# Patient Record
Sex: Male | Born: 1935 | Race: White | Hispanic: No | Marital: Married | Smoking: Former smoker
Health system: Southern US, Community
[De-identification: ages and names within clinical notes are randomized; demographics above are authoritative.]

## PROBLEM LIST (undated history)

## (undated) DIAGNOSIS — Z86718 Personal history of other venous thrombosis and embolism: Secondary | ICD-10-CM

## (undated) DIAGNOSIS — M1990 Unspecified osteoarthritis, unspecified site: Secondary | ICD-10-CM

## (undated) DIAGNOSIS — N429 Disorder of prostate, unspecified: Secondary | ICD-10-CM

## (undated) HISTORY — DX: Unspecified osteoarthritis, unspecified site: M19.90

## (undated) HISTORY — PX: PROSTATE SURGERY: SHX751

## (undated) HISTORY — DX: Personal history of other venous thrombosis and embolism: Z86.718

---

## 2005-02-24 ENCOUNTER — Ambulatory Visit: Payer: Self-pay | Admitting: Internal Medicine

## 2005-11-15 ENCOUNTER — Ambulatory Visit: Payer: Self-pay | Admitting: Anesthesiology

## 2005-11-15 ENCOUNTER — Other Ambulatory Visit: Payer: Self-pay

## 2005-11-23 ENCOUNTER — Ambulatory Visit: Payer: Self-pay | Admitting: Urology

## 2008-04-10 ENCOUNTER — Ambulatory Visit: Payer: Self-pay | Admitting: Internal Medicine

## 2012-02-27 DIAGNOSIS — E039 Hypothyroidism, unspecified: Secondary | ICD-10-CM | POA: Diagnosis not present

## 2012-02-27 DIAGNOSIS — Z125 Encounter for screening for malignant neoplasm of prostate: Secondary | ICD-10-CM | POA: Diagnosis not present

## 2012-02-27 DIAGNOSIS — E7800 Pure hypercholesterolemia, unspecified: Secondary | ICD-10-CM | POA: Diagnosis not present

## 2012-02-27 DIAGNOSIS — E785 Hyperlipidemia, unspecified: Secondary | ICD-10-CM | POA: Diagnosis not present

## 2012-02-27 DIAGNOSIS — D649 Anemia, unspecified: Secondary | ICD-10-CM | POA: Diagnosis not present

## 2012-02-27 DIAGNOSIS — N390 Urinary tract infection, site not specified: Secondary | ICD-10-CM | POA: Diagnosis not present

## 2012-03-29 DIAGNOSIS — R972 Elevated prostate specific antigen [PSA]: Secondary | ICD-10-CM | POA: Diagnosis not present

## 2012-05-24 DIAGNOSIS — E8881 Metabolic syndrome: Secondary | ICD-10-CM | POA: Diagnosis not present

## 2012-05-24 DIAGNOSIS — I10 Essential (primary) hypertension: Secondary | ICD-10-CM | POA: Diagnosis not present

## 2012-05-24 DIAGNOSIS — D291 Benign neoplasm of prostate: Secondary | ICD-10-CM | POA: Diagnosis not present

## 2012-05-24 DIAGNOSIS — M519 Unspecified thoracic, thoracolumbar and lumbosacral intervertebral disc disorder: Secondary | ICD-10-CM | POA: Diagnosis not present

## 2012-08-06 DIAGNOSIS — R972 Elevated prostate specific antigen [PSA]: Secondary | ICD-10-CM | POA: Insufficient documentation

## 2012-08-06 DIAGNOSIS — N401 Enlarged prostate with lower urinary tract symptoms: Secondary | ICD-10-CM | POA: Diagnosis not present

## 2012-08-21 DIAGNOSIS — E785 Hyperlipidemia, unspecified: Secondary | ICD-10-CM | POA: Diagnosis not present

## 2012-08-21 DIAGNOSIS — M519 Unspecified thoracic, thoracolumbar and lumbosacral intervertebral disc disorder: Secondary | ICD-10-CM | POA: Diagnosis not present

## 2012-08-21 DIAGNOSIS — D291 Benign neoplasm of prostate: Secondary | ICD-10-CM | POA: Diagnosis not present

## 2012-10-11 DIAGNOSIS — Z23 Encounter for immunization: Secondary | ICD-10-CM | POA: Diagnosis not present

## 2012-11-13 DIAGNOSIS — E8881 Metabolic syndrome: Secondary | ICD-10-CM | POA: Diagnosis not present

## 2012-11-13 DIAGNOSIS — I10 Essential (primary) hypertension: Secondary | ICD-10-CM | POA: Diagnosis not present

## 2012-11-20 DIAGNOSIS — R972 Elevated prostate specific antigen [PSA]: Secondary | ICD-10-CM | POA: Diagnosis not present

## 2013-02-28 DIAGNOSIS — Z125 Encounter for screening for malignant neoplasm of prostate: Secondary | ICD-10-CM | POA: Diagnosis not present

## 2013-02-28 DIAGNOSIS — D649 Anemia, unspecified: Secondary | ICD-10-CM | POA: Diagnosis not present

## 2013-02-28 DIAGNOSIS — E8881 Metabolic syndrome: Secondary | ICD-10-CM | POA: Diagnosis not present

## 2013-02-28 DIAGNOSIS — I10 Essential (primary) hypertension: Secondary | ICD-10-CM | POA: Diagnosis not present

## 2013-02-28 DIAGNOSIS — T887XXA Unspecified adverse effect of drug or medicament, initial encounter: Secondary | ICD-10-CM | POA: Diagnosis not present

## 2013-03-12 DIAGNOSIS — E8881 Metabolic syndrome: Secondary | ICD-10-CM | POA: Diagnosis not present

## 2013-03-12 DIAGNOSIS — M519 Unspecified thoracic, thoracolumbar and lumbosacral intervertebral disc disorder: Secondary | ICD-10-CM | POA: Diagnosis not present

## 2013-03-12 DIAGNOSIS — K649 Unspecified hemorrhoids: Secondary | ICD-10-CM | POA: Diagnosis not present

## 2013-03-12 DIAGNOSIS — I10 Essential (primary) hypertension: Secondary | ICD-10-CM | POA: Diagnosis not present

## 2013-06-17 DIAGNOSIS — E8881 Metabolic syndrome: Secondary | ICD-10-CM | POA: Diagnosis not present

## 2013-06-17 DIAGNOSIS — K649 Unspecified hemorrhoids: Secondary | ICD-10-CM | POA: Diagnosis not present

## 2013-06-17 DIAGNOSIS — E785 Hyperlipidemia, unspecified: Secondary | ICD-10-CM | POA: Diagnosis not present

## 2013-06-17 DIAGNOSIS — I10 Essential (primary) hypertension: Secondary | ICD-10-CM | POA: Diagnosis not present

## 2013-08-08 DIAGNOSIS — R972 Elevated prostate specific antigen [PSA]: Secondary | ICD-10-CM | POA: Diagnosis not present

## 2013-08-08 DIAGNOSIS — N401 Enlarged prostate with lower urinary tract symptoms: Secondary | ICD-10-CM | POA: Diagnosis not present

## 2013-10-21 DIAGNOSIS — M519 Unspecified thoracic, thoracolumbar and lumbosacral intervertebral disc disorder: Secondary | ICD-10-CM | POA: Diagnosis not present

## 2013-10-21 DIAGNOSIS — R972 Elevated prostate specific antigen [PSA]: Secondary | ICD-10-CM | POA: Diagnosis not present

## 2013-10-21 DIAGNOSIS — E785 Hyperlipidemia, unspecified: Secondary | ICD-10-CM | POA: Diagnosis not present

## 2013-11-18 DIAGNOSIS — Z23 Encounter for immunization: Secondary | ICD-10-CM | POA: Diagnosis not present

## 2014-01-27 DIAGNOSIS — I10 Essential (primary) hypertension: Secondary | ICD-10-CM | POA: Diagnosis not present

## 2014-01-27 DIAGNOSIS — M545 Low back pain, unspecified: Secondary | ICD-10-CM | POA: Diagnosis not present

## 2014-01-27 DIAGNOSIS — E785 Hyperlipidemia, unspecified: Secondary | ICD-10-CM | POA: Diagnosis not present

## 2014-02-11 DIAGNOSIS — R972 Elevated prostate specific antigen [PSA]: Secondary | ICD-10-CM | POA: Diagnosis not present

## 2014-04-29 DIAGNOSIS — I10 Essential (primary) hypertension: Secondary | ICD-10-CM | POA: Diagnosis not present

## 2014-04-29 DIAGNOSIS — H1030 Unspecified acute conjunctivitis, unspecified eye: Secondary | ICD-10-CM | POA: Diagnosis not present

## 2014-04-29 DIAGNOSIS — E8881 Metabolic syndrome: Secondary | ICD-10-CM | POA: Diagnosis not present

## 2014-08-01 DIAGNOSIS — M519 Unspecified thoracic, thoracolumbar and lumbosacral intervertebral disc disorder: Secondary | ICD-10-CM | POA: Diagnosis not present

## 2014-08-01 DIAGNOSIS — E8881 Metabolic syndrome: Secondary | ICD-10-CM | POA: Diagnosis not present

## 2014-08-01 DIAGNOSIS — D291 Benign neoplasm of prostate: Secondary | ICD-10-CM | POA: Diagnosis not present

## 2014-08-01 DIAGNOSIS — I10 Essential (primary) hypertension: Secondary | ICD-10-CM | POA: Diagnosis not present

## 2014-08-14 DIAGNOSIS — N401 Enlarged prostate with lower urinary tract symptoms: Secondary | ICD-10-CM | POA: Diagnosis not present

## 2014-08-14 DIAGNOSIS — R972 Elevated prostate specific antigen [PSA]: Secondary | ICD-10-CM | POA: Diagnosis not present

## 2014-08-14 DIAGNOSIS — Z720 Tobacco use: Secondary | ICD-10-CM | POA: Diagnosis not present

## 2014-10-03 DIAGNOSIS — Z23 Encounter for immunization: Secondary | ICD-10-CM | POA: Diagnosis not present

## 2014-11-21 DIAGNOSIS — I10 Essential (primary) hypertension: Secondary | ICD-10-CM | POA: Diagnosis not present

## 2014-11-21 DIAGNOSIS — E8881 Metabolic syndrome: Secondary | ICD-10-CM | POA: Diagnosis not present

## 2014-11-21 DIAGNOSIS — M5186 Other intervertebral disc disorders, lumbar region: Secondary | ICD-10-CM | POA: Diagnosis not present

## 2014-12-31 DIAGNOSIS — N401 Enlarged prostate with lower urinary tract symptoms: Secondary | ICD-10-CM | POA: Diagnosis not present

## 2014-12-31 DIAGNOSIS — R972 Elevated prostate specific antigen [PSA]: Secondary | ICD-10-CM | POA: Diagnosis not present

## 2015-01-07 DIAGNOSIS — H6123 Impacted cerumen, bilateral: Secondary | ICD-10-CM | POA: Diagnosis not present

## 2015-01-07 DIAGNOSIS — H903 Sensorineural hearing loss, bilateral: Secondary | ICD-10-CM | POA: Diagnosis not present

## 2015-03-27 DIAGNOSIS — M5186 Other intervertebral disc disorders, lumbar region: Secondary | ICD-10-CM | POA: Diagnosis not present

## 2015-03-27 DIAGNOSIS — E784 Other hyperlipidemia: Secondary | ICD-10-CM | POA: Diagnosis not present

## 2015-03-27 DIAGNOSIS — I10 Essential (primary) hypertension: Secondary | ICD-10-CM | POA: Diagnosis not present

## 2015-07-28 DIAGNOSIS — E784 Other hyperlipidemia: Secondary | ICD-10-CM | POA: Diagnosis not present

## 2015-10-12 DIAGNOSIS — R972 Elevated prostate specific antigen [PSA]: Secondary | ICD-10-CM | POA: Diagnosis not present

## 2015-10-12 DIAGNOSIS — N401 Enlarged prostate with lower urinary tract symptoms: Secondary | ICD-10-CM | POA: Diagnosis not present

## 2015-10-12 DIAGNOSIS — Z6828 Body mass index (BMI) 28.0-28.9, adult: Secondary | ICD-10-CM | POA: Diagnosis not present

## 2015-11-20 DIAGNOSIS — Z125 Encounter for screening for malignant neoplasm of prostate: Secondary | ICD-10-CM | POA: Diagnosis not present

## 2015-11-20 DIAGNOSIS — E784 Other hyperlipidemia: Secondary | ICD-10-CM | POA: Diagnosis not present

## 2015-11-20 DIAGNOSIS — R5381 Other malaise: Secondary | ICD-10-CM | POA: Diagnosis not present

## 2015-11-20 DIAGNOSIS — I10 Essential (primary) hypertension: Secondary | ICD-10-CM | POA: Diagnosis not present

## 2015-11-27 DIAGNOSIS — I10 Essential (primary) hypertension: Secondary | ICD-10-CM | POA: Diagnosis not present

## 2015-11-27 DIAGNOSIS — E784 Other hyperlipidemia: Secondary | ICD-10-CM | POA: Diagnosis not present

## 2015-11-27 DIAGNOSIS — D291 Benign neoplasm of prostate: Secondary | ICD-10-CM | POA: Diagnosis not present

## 2016-03-24 DIAGNOSIS — D291 Benign neoplasm of prostate: Secondary | ICD-10-CM | POA: Diagnosis not present

## 2016-03-24 DIAGNOSIS — I10 Essential (primary) hypertension: Secondary | ICD-10-CM | POA: Diagnosis not present

## 2016-03-24 DIAGNOSIS — E784 Other hyperlipidemia: Secondary | ICD-10-CM | POA: Diagnosis not present

## 2016-03-24 DIAGNOSIS — E8881 Metabolic syndrome: Secondary | ICD-10-CM | POA: Diagnosis not present

## 2016-07-29 DIAGNOSIS — I10 Essential (primary) hypertension: Secondary | ICD-10-CM | POA: Diagnosis not present

## 2016-07-29 DIAGNOSIS — Z23 Encounter for immunization: Secondary | ICD-10-CM | POA: Diagnosis not present

## 2016-07-29 DIAGNOSIS — E8881 Metabolic syndrome: Secondary | ICD-10-CM | POA: Diagnosis not present

## 2016-07-29 DIAGNOSIS — M5186 Other intervertebral disc disorders, lumbar region: Secondary | ICD-10-CM | POA: Diagnosis not present

## 2016-07-29 DIAGNOSIS — D291 Benign neoplasm of prostate: Secondary | ICD-10-CM | POA: Diagnosis not present

## 2016-11-25 DIAGNOSIS — K649 Unspecified hemorrhoids: Secondary | ICD-10-CM | POA: Diagnosis not present

## 2016-11-25 DIAGNOSIS — E784 Other hyperlipidemia: Secondary | ICD-10-CM | POA: Diagnosis not present

## 2016-11-25 DIAGNOSIS — M549 Dorsalgia, unspecified: Secondary | ICD-10-CM | POA: Diagnosis not present

## 2016-11-25 DIAGNOSIS — I10 Essential (primary) hypertension: Secondary | ICD-10-CM | POA: Diagnosis not present

## 2017-03-10 DIAGNOSIS — R5381 Other malaise: Secondary | ICD-10-CM | POA: Diagnosis not present

## 2017-03-10 DIAGNOSIS — Z125 Encounter for screening for malignant neoplasm of prostate: Secondary | ICD-10-CM | POA: Diagnosis not present

## 2017-03-10 DIAGNOSIS — E784 Other hyperlipidemia: Secondary | ICD-10-CM | POA: Diagnosis not present

## 2017-03-10 DIAGNOSIS — I10 Essential (primary) hypertension: Secondary | ICD-10-CM | POA: Diagnosis not present

## 2017-03-16 DIAGNOSIS — I509 Heart failure, unspecified: Secondary | ICD-10-CM | POA: Diagnosis not present

## 2017-03-16 DIAGNOSIS — M549 Dorsalgia, unspecified: Secondary | ICD-10-CM | POA: Diagnosis not present

## 2017-03-16 DIAGNOSIS — K649 Unspecified hemorrhoids: Secondary | ICD-10-CM | POA: Diagnosis not present

## 2017-03-16 DIAGNOSIS — I10 Essential (primary) hypertension: Secondary | ICD-10-CM | POA: Diagnosis not present

## 2017-08-03 DIAGNOSIS — D291 Benign neoplasm of prostate: Secondary | ICD-10-CM | POA: Diagnosis not present

## 2017-08-03 DIAGNOSIS — I509 Heart failure, unspecified: Secondary | ICD-10-CM | POA: Diagnosis not present

## 2017-08-03 DIAGNOSIS — Z23 Encounter for immunization: Secondary | ICD-10-CM | POA: Diagnosis not present

## 2017-08-03 DIAGNOSIS — E784 Other hyperlipidemia: Secondary | ICD-10-CM | POA: Diagnosis not present

## 2017-09-21 DIAGNOSIS — H6123 Impacted cerumen, bilateral: Secondary | ICD-10-CM | POA: Diagnosis not present

## 2017-09-21 DIAGNOSIS — H60339 Swimmer's ear, unspecified ear: Secondary | ICD-10-CM | POA: Diagnosis not present

## 2017-09-21 DIAGNOSIS — H903 Sensorineural hearing loss, bilateral: Secondary | ICD-10-CM | POA: Diagnosis not present

## 2018-04-12 DIAGNOSIS — I10 Essential (primary) hypertension: Secondary | ICD-10-CM | POA: Diagnosis not present

## 2018-04-12 DIAGNOSIS — E785 Hyperlipidemia, unspecified: Secondary | ICD-10-CM | POA: Diagnosis not present

## 2018-04-12 DIAGNOSIS — K649 Unspecified hemorrhoids: Secondary | ICD-10-CM | POA: Diagnosis not present

## 2018-04-12 DIAGNOSIS — I509 Heart failure, unspecified: Secondary | ICD-10-CM | POA: Diagnosis not present

## 2018-05-11 ENCOUNTER — Emergency Department: Payer: Medicare Other

## 2018-05-11 ENCOUNTER — Inpatient Hospital Stay
Admission: EM | Admit: 2018-05-11 | Discharge: 2018-05-15 | DRG: 566 | Disposition: A | Discharge status: Home Health | Payer: Medicare Other | Attending: Internal Medicine | Admitting: Internal Medicine

## 2018-05-11 DIAGNOSIS — M25511 Pain in right shoulder: Secondary | ICD-10-CM | POA: Diagnosis not present

## 2018-05-11 DIAGNOSIS — E876 Hypokalemia: Secondary | ICD-10-CM | POA: Diagnosis present

## 2018-05-11 DIAGNOSIS — W19XXXA Unspecified fall, initial encounter: Secondary | ICD-10-CM

## 2018-05-11 DIAGNOSIS — G8929 Other chronic pain: Secondary | ICD-10-CM | POA: Diagnosis not present

## 2018-05-11 DIAGNOSIS — Y92015 Private garage of single-family (private) house as the place of occurrence of the external cause: Secondary | ICD-10-CM

## 2018-05-11 DIAGNOSIS — R350 Frequency of micturition: Secondary | ICD-10-CM | POA: Diagnosis present

## 2018-05-11 DIAGNOSIS — T796XXA Traumatic ischemia of muscle, initial encounter: Principal | ICD-10-CM | POA: Diagnosis present

## 2018-05-11 DIAGNOSIS — Z791 Long term (current) use of non-steroidal anti-inflammatories (NSAID): Secondary | ICD-10-CM

## 2018-05-11 DIAGNOSIS — I2510 Atherosclerotic heart disease of native coronary artery without angina pectoris: Secondary | ICD-10-CM | POA: Diagnosis present

## 2018-05-11 DIAGNOSIS — I214 Non-ST elevation (NSTEMI) myocardial infarction: Secondary | ICD-10-CM | POA: Diagnosis present

## 2018-05-11 DIAGNOSIS — S4991XA Unspecified injury of right shoulder and upper arm, initial encounter: Secondary | ICD-10-CM | POA: Diagnosis not present

## 2018-05-11 DIAGNOSIS — Z87891 Personal history of nicotine dependence: Secondary | ICD-10-CM

## 2018-05-11 DIAGNOSIS — M545 Low back pain: Secondary | ICD-10-CM | POA: Diagnosis not present

## 2018-05-11 DIAGNOSIS — N429 Disorder of prostate, unspecified: Secondary | ICD-10-CM | POA: Diagnosis present

## 2018-05-11 DIAGNOSIS — Z66 Do not resuscitate: Secondary | ICD-10-CM | POA: Diagnosis not present

## 2018-05-11 DIAGNOSIS — R748 Abnormal levels of other serum enzymes: Secondary | ICD-10-CM | POA: Diagnosis not present

## 2018-05-11 DIAGNOSIS — W1830XA Fall on same level, unspecified, initial encounter: Secondary | ICD-10-CM | POA: Diagnosis present

## 2018-05-11 DIAGNOSIS — R7989 Other specified abnormal findings of blood chemistry: Secondary | ICD-10-CM | POA: Diagnosis not present

## 2018-05-11 DIAGNOSIS — R778 Other specified abnormalities of plasma proteins: Secondary | ICD-10-CM

## 2018-05-11 DIAGNOSIS — S42141S Displaced fracture of glenoid cavity of scapula, right shoulder, sequela: Secondary | ICD-10-CM

## 2018-05-11 DIAGNOSIS — Z7982 Long term (current) use of aspirin: Secondary | ICD-10-CM | POA: Diagnosis not present

## 2018-05-11 DIAGNOSIS — S0990XA Unspecified injury of head, initial encounter: Secondary | ICD-10-CM | POA: Diagnosis not present

## 2018-05-11 DIAGNOSIS — N401 Enlarged prostate with lower urinary tract symptoms: Secondary | ICD-10-CM | POA: Diagnosis present

## 2018-05-11 DIAGNOSIS — I441 Atrioventricular block, second degree: Secondary | ICD-10-CM | POA: Diagnosis present

## 2018-05-11 DIAGNOSIS — S46011A Strain of muscle(s) and tendon(s) of the rotator cuff of right shoulder, initial encounter: Secondary | ICD-10-CM | POA: Diagnosis present

## 2018-05-11 HISTORY — DX: Disorder of prostate, unspecified: N42.9

## 2018-05-11 LAB — COMPREHENSIVE METABOLIC PANEL
ALK PHOS: 30 U/L — AB (ref 38–126)
ALT: 17 U/L (ref 0–44)
AST: 34 U/L (ref 15–41)
Albumin: 3.3 g/dL — ABNORMAL LOW (ref 3.5–5.0)
Anion gap: 8 (ref 5–15)
BUN: 17 mg/dL (ref 8–23)
CALCIUM: 8.5 mg/dL — AB (ref 8.9–10.3)
CO2: 25 mmol/L (ref 22–32)
CREATININE: 0.95 mg/dL (ref 0.61–1.24)
Chloride: 100 mmol/L (ref 98–111)
Glucose, Bld: 155 mg/dL — ABNORMAL HIGH (ref 70–99)
Potassium: 3.4 mmol/L — ABNORMAL LOW (ref 3.5–5.1)
Sodium: 133 mmol/L — ABNORMAL LOW (ref 135–145)
Total Bilirubin: 1.2 mg/dL (ref 0.3–1.2)
Total Protein: 5.7 g/dL — ABNORMAL LOW (ref 6.5–8.1)

## 2018-05-11 LAB — CBC WITH DIFFERENTIAL/PLATELET
Basophils Absolute: 0 10*3/uL (ref 0–0.1)
Basophils Relative: 0 %
Eosinophils Absolute: 0 10*3/uL (ref 0–0.7)
Eosinophils Relative: 0 %
HCT: 39.7 % — ABNORMAL LOW (ref 40.0–52.0)
HEMOGLOBIN: 13.7 g/dL (ref 13.0–18.0)
LYMPHS ABS: 0.5 10*3/uL — AB (ref 1.0–3.6)
Lymphocytes Relative: 5 %
MCH: 31 pg (ref 26.0–34.0)
MCHC: 34.5 g/dL (ref 32.0–36.0)
MCV: 89.9 fL (ref 80.0–100.0)
Monocytes Absolute: 0.8 10*3/uL (ref 0.2–1.0)
Monocytes Relative: 7 %
NEUTROS ABS: 10.5 10*3/uL — AB (ref 1.4–6.5)
NEUTROS PCT: 88 %
Platelets: 171 10*3/uL (ref 150–440)
RBC: 4.41 MIL/uL (ref 4.40–5.90)
RDW: 14.5 % (ref 11.5–14.5)
WBC: 11.9 10*3/uL — AB (ref 3.8–10.6)

## 2018-05-11 LAB — CK: Total CK: 441 U/L — ABNORMAL HIGH (ref 49–397)

## 2018-05-11 LAB — TROPONIN I: TROPONIN I: 0.08 ng/mL — AB (ref <0.03)

## 2018-05-11 MED ORDER — ACETAMINOPHEN 500 MG PO TABS
1000.0000 mg | ORAL_TABLET | Freq: Once | ORAL | Status: AC
  Administered 2018-05-11: 1000 mg via ORAL
  Filled 2018-05-11: qty 2

## 2018-05-11 MED ORDER — ASPIRIN 81 MG PO CHEW
324.0000 mg | CHEWABLE_TABLET | Freq: Once | ORAL | Status: AC
  Administered 2018-05-11: 324 mg via ORAL
  Filled 2018-05-11: qty 4

## 2018-05-11 NOTE — ED Triage Notes (Signed)
Patient coming from home for unwitnessed mechanical fall in his garage. Patient tender to palpation of right shoulder. Patient c/o pain to right shoulder and lower back. Patient denies LOC, dizziness, weakness. Patient's family reports increasing gait problems over the last few weeks due to increasing chronic back pain.

## 2018-05-11 NOTE — ED Notes (Signed)
Patient speaks greek and is very [place]. Family at bedside.

## 2018-05-11 NOTE — ED Provider Notes (Signed)
[facility] Emergency Department Provider Note       Time seen: ----------------------------------------- 9:07 PM on 05/11/2018 -----------------------------------------   I have reviewed the triage vital signs and the nursing notes.  HISTORY   Chief Complaint Fall (unwitnessed)    HPI Henry [name] is a 84 y.o. male with a history of prostate disorder who presents to the ED for a fall.  Patient had an unwitnessed fall at home.  Family was unsure how long he was on the floor of the garage.  He was diaphoretic with low blood pressure at EMS arrival.  Initial blood pressure was 92/60.  He was given a 500 cc normal saline IV fluid bolus and his blood pressure increased to 120/70.  He is complaining of right shoulder pain and low back pain.  He denies hitting his head.  Family reports he had one episode of vomiting at some point during this event.  Past Medical History:  Diagnosis Date  . Prostate disorder     There are no active problems to display for this patient.   Past Surgical History:  Procedure Laterality Date  . PROSTATE SURGERY      Allergies Patient has no known allergies.  Social History Social History   Tobacco Use  . Smoking status: Former Research scientist (life sciences)  . Smokeless tobacco: Never Used  Substance Use Topics  . Alcohol use: Yes  . Drug use: Not on file   Review of Systems Constitutional: Negative for fever. Cardiovascular: Negative for chest pain. Respiratory: Negative for shortness of breath. Gastrointestinal: Negative for abdominal pain, vomiting and diarrhea. Musculoskeletal: Positive for right shoulder pain and low back pain Skin: Negative for rash. Neurological: Negative for headaches, focal weakness or numbness.  All systems negative/normal/unremarkable except as stated in the HPI  ____________________________________________   PHYSICAL EXAM:  VITAL SIGNS: ED Triage Vitals  Enc Vitals Group     BP 05/11/18 2050 130/63      Pulse Rate 05/11/18 2050 (!) 55     Resp 05/11/18 2050 18     Temp 05/11/18 2050 97.7 F (36.5 C)     Temp Source 05/11/18 2050 Oral     SpO2 05/11/18 2050 94 %     Weight 05/11/18 2052 162 lb (73.5 kg)     Height 05/11/18 2052 5\' 8"  (1.727 m)     Head Circumference --      Peak Flow --      Pain Score --      Pain Loc --      Pain Edu? --      Excl. in [place]? --    Constitutional: Alert and oriented. Well appearing and in no distress. Eyes: Conjunctivae are normal. Normal extraocular movements. ENT   Head: Normocephalic and atraumatic.   Nose: No congestion/rhinnorhea.   Mouth/Throat: Mucous membranes are moist.   Neck: No stridor. Cardiovascular: Normal rate, regular rhythm. No murmurs, rubs, or gallops. Respiratory: Normal respiratory effort without tachypnea nor retractions. Breath sounds are clear and equal bilaterally. No wheezes/rales/rhonchi. Gastrointestinal: Soft and nontender. Normal bowel sounds Musculoskeletal: Nontender with normal range of motion in extremities. No lower extremity tenderness nor edema. Neurologic:  Normal speech and language. No gross focal neurologic deficits are appreciated.  Skin:  Skin is warm, dry and intact. No rash noted. Psychiatric: Mood and affect are normal. Speech and behavior are normal.  ____________________________________________  EKG: Interpreted by me.  Sinus rhythm the rate of 57 bpm, prolonged PR interval, left axis deviation, normal QT  ____________________________________________  ED COURSE:  As part of my medical decision making, I reviewed the following data within the [place] History obtained from family if available, nursing notes, old chart and ekg, as well as notes from prior ED visits. Patient presented for an unwitnessed fall, we will assess with labs and imaging as indicated at this time.   Procedures ____________________________________________   LABS (pertinent  positives/negatives)  Labs Reviewed  CK - Abnormal; Notable for the following components:      Result Value   Total CK 441 (*)    All other components within normal limits  CBC WITH DIFFERENTIAL/PLATELET - Abnormal; Notable for the following components:   WBC 11.9 (*)    HCT 39.7 (*)    Neutro Abs 10.5 (*)    Lymphs Abs 0.5 (*)    All other components within normal limits  COMPREHENSIVE METABOLIC PANEL - Abnormal; Notable for the following components:   Sodium 133 (*)    Potassium 3.4 (*)    Glucose, Bld 155 (*)    Calcium 8.5 (*)    Total Protein 5.7 (*)    Albumin 3.3 (*)    Alkaline Phosphatase 30 (*)    All other components within normal limits  TROPONIN I - Abnormal; Notable for the following components:   Troponin I 0.08 (*)    All other components within normal limits  URINALYSIS, COMPLETE (UACMP) WITH MICROSCOPIC    RADIOLOGY Images were viewed by me  CT head, right shoulder x-ray, lumbar spine x-ray IMPRESSION: 1. No CT evidence for acute intracranial abnormality. 2. Significant atrophy and small vessel ischemic changes of the white matter IMPRESSION: Degenerative changes.  No acute osseous abnormality. IMPRESSION: 1. No dislocation. 2. Age indeterminate fracture off the inferior glenoid rim. 3. High-riding humeral head suggesting rotator cuff disease  ____________________________________________  DIFFERENTIAL DIAGNOSIS   Fall, subdural, fracture, dehydration, electrolyte abnormality, heat related illness, orthostatic hypotension  FINAL ASSESSMENT AND PLAN  Fall, possible inferior glenoid rim fracture, elevated troponin, mildly elevated CK level   Plan: The patient had presented for an unwitnessed fall at home. Patient's labs did reveal an elevated troponin of 0.08 and a mildly elevated CK level.  Otherwise his labs were grossly unremarkable. Patient's imaging was concerning for possible inferior glenoid rim fracture.  He will be placed in a shoulder  immobilizer.  Given the fact that the fall is unexplained with worrisome sounding symptoms including diaphoresis and vomiting.  I will discuss with the hospitalist for admission.   Laurence Aly, MD   Note: This note was generated in part or whole with voice recognition software. Voice recognition is usually quite accurate but there are transcription errors that can and very often do occur. I apologize for any typographical errors that were not detected and corrected.     Earleen Newport, MD 05/11/18 2218

## 2018-05-11 NOTE — ED Notes (Signed)
Patient's family is unsure how long he was on the floor of the garage. Patient was diaporetic with low BP at EMS arrival. Patient's initial BP 92/60. Patient given 500 mL NaCl in transport, and his BP increased to 120/70 immediately prior to arrival at this ED

## 2018-05-11 NOTE — ED Notes (Signed)
ED Provider at bedside. 

## 2018-05-11 NOTE — ED Notes (Signed)
Patient transported to radiology department. 

## 2018-05-12 ENCOUNTER — Inpatient Hospital Stay
Admit: 2018-05-12 | Discharge: 2018-05-12 | Disposition: A | Discharge status: Home / Self Care | Payer: Medicare Other | Attending: Internal Medicine | Admitting: Internal Medicine

## 2018-05-12 ENCOUNTER — Encounter: Payer: Self-pay | Admitting: Emergency Medicine

## 2018-05-12 ENCOUNTER — Other Ambulatory Visit: Payer: Self-pay

## 2018-05-12 DIAGNOSIS — S43421A Sprain of right rotator cuff capsule, initial encounter: Secondary | ICD-10-CM | POA: Diagnosis not present

## 2018-05-12 DIAGNOSIS — G8929 Other chronic pain: Secondary | ICD-10-CM | POA: Diagnosis present

## 2018-05-12 DIAGNOSIS — M549 Dorsalgia, unspecified: Secondary | ICD-10-CM | POA: Diagnosis not present

## 2018-05-12 DIAGNOSIS — T796XXA Traumatic ischemia of muscle, initial encounter: Secondary | ICD-10-CM | POA: Diagnosis present

## 2018-05-12 DIAGNOSIS — Z7982 Long term (current) use of aspirin: Secondary | ICD-10-CM | POA: Diagnosis not present

## 2018-05-12 DIAGNOSIS — R079 Chest pain, unspecified: Secondary | ICD-10-CM | POA: Diagnosis not present

## 2018-05-12 DIAGNOSIS — N429 Disorder of prostate, unspecified: Secondary | ICD-10-CM | POA: Diagnosis present

## 2018-05-12 DIAGNOSIS — R350 Frequency of micturition: Secondary | ICD-10-CM | POA: Diagnosis present

## 2018-05-12 DIAGNOSIS — M6282 Rhabdomyolysis: Secondary | ICD-10-CM | POA: Diagnosis not present

## 2018-05-12 DIAGNOSIS — M545 Low back pain: Secondary | ICD-10-CM | POA: Diagnosis present

## 2018-05-12 DIAGNOSIS — N401 Enlarged prostate with lower urinary tract symptoms: Secondary | ICD-10-CM | POA: Diagnosis present

## 2018-05-12 DIAGNOSIS — E876 Hypokalemia: Secondary | ICD-10-CM | POA: Diagnosis present

## 2018-05-12 DIAGNOSIS — Z66 Do not resuscitate: Secondary | ICD-10-CM | POA: Diagnosis present

## 2018-05-12 DIAGNOSIS — I214 Non-ST elevation (NSTEMI) myocardial infarction: Secondary | ICD-10-CM | POA: Diagnosis not present

## 2018-05-12 DIAGNOSIS — R7989 Other specified abnormal findings of blood chemistry: Secondary | ICD-10-CM | POA: Diagnosis present

## 2018-05-12 DIAGNOSIS — Z87891 Personal history of nicotine dependence: Secondary | ICD-10-CM | POA: Diagnosis not present

## 2018-05-12 DIAGNOSIS — Z791 Long term (current) use of non-steroidal anti-inflammatories (NSAID): Secondary | ICD-10-CM | POA: Diagnosis not present

## 2018-05-12 DIAGNOSIS — M25511 Pain in right shoulder: Secondary | ICD-10-CM | POA: Diagnosis not present

## 2018-05-12 DIAGNOSIS — W19XXXA Unspecified fall, initial encounter: Secondary | ICD-10-CM | POA: Diagnosis not present

## 2018-05-12 DIAGNOSIS — I455 Other specified heart block: Secondary | ICD-10-CM | POA: Diagnosis not present

## 2018-05-12 DIAGNOSIS — I441 Atrioventricular block, second degree: Secondary | ICD-10-CM | POA: Diagnosis present

## 2018-05-12 DIAGNOSIS — I2510 Atherosclerotic heart disease of native coronary artery without angina pectoris: Secondary | ICD-10-CM | POA: Diagnosis present

## 2018-05-12 DIAGNOSIS — W1830XA Fall on same level, unspecified, initial encounter: Secondary | ICD-10-CM | POA: Diagnosis present

## 2018-05-12 DIAGNOSIS — R748 Abnormal levels of other serum enzymes: Secondary | ICD-10-CM | POA: Diagnosis not present

## 2018-05-12 DIAGNOSIS — R55 Syncope and collapse: Secondary | ICD-10-CM | POA: Diagnosis not present

## 2018-05-12 DIAGNOSIS — S46011A Strain of muscle(s) and tendon(s) of the rotator cuff of right shoulder, initial encounter: Secondary | ICD-10-CM | POA: Diagnosis present

## 2018-05-12 DIAGNOSIS — Y92015 Private garage of single-family (private) house as the place of occurrence of the external cause: Secondary | ICD-10-CM | POA: Diagnosis not present

## 2018-05-12 LAB — TROPONIN I
Troponin I: 0.16 ng/mL (ref <0.03)
Troponin I: 0.12 ng/mL (ref <0.03)

## 2018-05-12 LAB — CBC
HEMATOCRIT: 39.2 % — AB (ref 40.0–52.0)
Hemoglobin: 13.6 g/dL (ref 13.0–18.0)
MCH: 31.3 pg (ref 26.0–34.0)
MCHC: 34.8 g/dL (ref 32.0–36.0)
MCV: 89.9 fL (ref 80.0–100.0)
Platelets: 162 10*3/uL (ref 150–440)
RBC: 4.36 MIL/uL — AB (ref 4.40–5.90)
RDW: 14.1 % (ref 11.5–14.5)
WBC: 7.6 10*3/uL (ref 3.8–10.6)

## 2018-05-12 LAB — URINALYSIS, COMPLETE (UACMP) WITH MICROSCOPIC
Bacteria, UA: NONE SEEN
Bilirubin Urine: NEGATIVE
GLUCOSE, UA: NEGATIVE mg/dL
HGB URINE DIPSTICK: NEGATIVE
Ketones, ur: NEGATIVE mg/dL
LEUKOCYTES UA: NEGATIVE
NITRITE: NEGATIVE
PH: 5 (ref 5.0–8.0)
Protein, ur: 30 mg/dL — AB
SPECIFIC GRAVITY, URINE: 1.026 (ref 1.005–1.030)
Squamous Epithelial / LPF: NONE SEEN (ref 0–5)

## 2018-05-12 LAB — BASIC METABOLIC PANEL
ANION GAP: 7 (ref 5–15)
BUN: 16 mg/dL (ref 8–23)
CO2: 27 mmol/L (ref 22–32)
Calcium: 8.4 mg/dL — ABNORMAL LOW (ref 8.9–10.3)
Chloride: 100 mmol/L (ref 98–111)
Creatinine, Ser: 0.74 mg/dL (ref 0.61–1.24)
Glucose, Bld: 102 mg/dL — ABNORMAL HIGH (ref 70–99)
POTASSIUM: 3.8 mmol/L (ref 3.5–5.1)
Sodium: 134 mmol/L — ABNORMAL LOW (ref 135–145)

## 2018-05-12 LAB — ECHOCARDIOGRAM COMPLETE
HEIGHTINCHES: 68 in
Weight: 2665.6 oz

## 2018-05-12 LAB — GLUCOSE, CAPILLARY
GLUCOSE-CAPILLARY: 87 mg/dL (ref 70–99)
Glucose-Capillary: 106 mg/dL — ABNORMAL HIGH (ref 70–99)

## 2018-05-12 MED ORDER — ATORVASTATIN CALCIUM 20 MG PO TABS
40.0000 mg | ORAL_TABLET | Freq: Every day | ORAL | Status: DC
  Filled 2018-05-12: qty 2

## 2018-05-12 MED ORDER — ACETAMINOPHEN 650 MG RE SUPP: 650.0000 mg | Freq: Four times a day (QID) | RECTAL | Status: DC | PRN

## 2018-05-12 MED ORDER — HYDROCODONE-ACETAMINOPHEN 5-325 MG PO TABS: 1.0000 | ORAL_TABLET | ORAL | Status: DC | PRN

## 2018-05-12 MED ORDER — ONDANSETRON HCL 4 MG PO TABS: 4.0000 mg | ORAL_TABLET | Freq: Four times a day (QID) | ORAL | Status: DC | PRN

## 2018-05-12 MED ORDER — DOCUSATE SODIUM 100 MG PO CAPS
100.0000 mg | ORAL_CAPSULE | Freq: Two times a day (BID) | ORAL | Status: DC
  Administered 2018-05-13 – 2018-05-14 (×3): 100 mg via ORAL
  Filled 2018-05-12 (×4): qty 1

## 2018-05-12 MED ORDER — SODIUM CHLORIDE 0.9 % IV SOLN
INTRAVENOUS | Status: DC
  Administered 2018-05-12 – 2018-05-15 (×3): via INTRAVENOUS

## 2018-05-12 MED ORDER — TAMSULOSIN HCL 0.4 MG PO CAPS
0.4000 mg | ORAL_CAPSULE | Freq: Every day | ORAL | Status: DC
  Administered 2018-05-12 – 2018-05-15 (×4): 0.4 mg via ORAL
  Filled 2018-05-12 (×4): qty 1

## 2018-05-12 MED ORDER — FINASTERIDE 5 MG PO TABS
5.0000 mg | ORAL_TABLET | Freq: Every day | ORAL | Status: DC
  Administered 2018-05-12 – 2018-05-15 (×4): 5 mg via ORAL
  Filled 2018-05-12 (×4): qty 1

## 2018-05-12 MED ORDER — BISACODYL 5 MG PO TBEC: 5.0000 mg | DELAYED_RELEASE_TABLET | Freq: Every day | ORAL | Status: DC | PRN

## 2018-05-12 MED ORDER — HEPARIN SODIUM (PORCINE) 5000 UNIT/ML IJ SOLN
5000.0000 [IU] | Freq: Three times a day (TID) | INTRAMUSCULAR | Status: DC
  Filled 2018-05-12 (×2): qty 1

## 2018-05-12 MED ORDER — TAMSULOSIN HCL 0.4 MG PO CAPS: 4.0000 mg | ORAL_CAPSULE | Freq: Every day | ORAL | Status: DC

## 2018-05-12 MED ORDER — ISOSORBIDE MONONITRATE ER 30 MG PO TB24
30.0000 mg | ORAL_TABLET | Freq: Every day | ORAL | Status: DC
  Administered 2018-05-12 – 2018-05-13 (×2): 30 mg via ORAL
  Filled 2018-05-12 (×2): qty 1

## 2018-05-12 MED ORDER — ACETAMINOPHEN 325 MG PO TABS
650.0000 mg | ORAL_TABLET | Freq: Four times a day (QID) | ORAL | Status: DC | PRN
  Administered 2018-05-14: 650 mg via ORAL
  Filled 2018-05-12: qty 2

## 2018-05-12 MED ORDER — ASPIRIN EC 81 MG PO TBEC
81.0000 mg | DELAYED_RELEASE_TABLET | Freq: Every day | ORAL | Status: DC
  Administered 2018-05-12 – 2018-05-15 (×4): 81 mg via ORAL
  Filled 2018-05-12 (×4): qty 1

## 2018-05-12 MED ORDER — ONDANSETRON HCL 4 MG/2ML IJ SOLN: 4.0000 mg | Freq: Four times a day (QID) | INTRAMUSCULAR | Status: DC | PRN

## 2018-05-12 MED ORDER — TRAZODONE HCL 50 MG PO TABS: 25.0000 mg | ORAL_TABLET | Freq: Every evening | ORAL | Status: DC | PRN

## 2018-05-12 NOTE — Progress Notes (Signed)
PT Cancellation Note  Patient Details Name: MANSOUR BALBOA MRN: [id number] DOB: Mar 12, 1925   Cancelled Treatment:    Reason Eval/Treat Not Completed: Other (comment). Consult received and chart reviewed. Pt comes in with fall and now pending ortho consult for R shoulder as well as cardio consult secondary to increasing troponin. Will hold at this time until medically cleared to participate with therapy. Thank you for this consult.   Cowan Pilar 05/12/2018, 10:03 AM  Greggory Stallion, PT, DPT [phone]

## 2018-05-12 NOTE — Progress Notes (Signed)
Patient ID: Henry [name], male   DOB: 18-Feb-1925, 84 y.o.   MRN: [id number]  ACP note  Patient and daughter-in-law at the bedside who is 1 of the patient's POA's.  Diagnosis: Elevated troponin which is rising, fall and chronic back pain, right shoulder pain, hypokalemia, BPH.   CODE STATUS discussed.  Patient is a DO NOT RESUSCITATE.  Order changed in the computer.  Plan.  Send off a third troponin.  Medical management with aspirin, Imdur and Lipitor.  Patient not having any chest pain or shortness of breath currently.  Decrease rate of IV fluid hydration.  Add finasteride for the prostate.  Physical therapy evaluation for his gait and low back pain.  Time spent on ACP discussion 25 minutes  Dr. Loletha Grayer

## 2018-05-12 NOTE — Progress Notes (Signed)
*  PRELIMINARY RESULTS* Echocardiogram 2D Echocardiogram has been performed.  Henry [name] Maysel Mccolm 05/12/2018, 2:28 PM

## 2018-05-12 NOTE — H&P (Signed)
[name] [place] NAME: Henry [name]    MR#:  [id number]  DATE OF BIRTH:  1925/08/05  DATE OF ADMISSION:  05/11/2018  PRIMARY CARE PHYSICIAN: [name], MD   REQUESTING/REFERRING PHYSICIAN:   CHIEF COMPLAINT:   Chief Complaint  Patient presents with  . Fall    unwitnessed    HISTORY OF PRESENT ILLNESS: Henry [name]  is a 84 y.o. male with a known history of BPH. Patient is a poor historian and somewhat drowsy at this time, unable to provide full reliable history.  Information was taken from reviewing the medical records and from discussion with emergency room physician and the family. Patient was brought to emergency room status post unwitnessed mechanical fall in his garage.  He does not know for how long he was down but per family, it could not have been more than 4 to 5 hours, because they checked on him around 1 PM and then again around 6 PM.  Patient's family reports increasing gait problems over the last few weeks, due to increasing chronic lower back pain.  His vision is also poor, making him prone to falls. Paramedics arrived, his blood pressure was 92/60.  After 500 cc normal saline bolus, the blood pressure improved to 120/70.  No reports of chest pain, dizziness, palpitations.  Patient denies loss of consciousness.  He complains of right shoulder and back pain. Blood test results are remarkable for WBC at 11.9, CPK at 441, potassium at 3.4, troponin level at 0.08.  EKG shows normal sinus rhythm with a heart rate of 57 bpm, no acute ischemic changes. L-spine x-ray shows degenerative changes but no acute fractures.  Right shoulder x-ray shows no dislocation.  There is age indeterminate fracture off the inferior glenoid rim.  High-riding humeral head suggesting rotator cuff disease. CT scan of the brain is negative for acute intracranial abnormality. Patient is admitted for further evaluation and treatment.    PAST MEDICAL  HISTORY:   Past Medical History:  Diagnosis Date  . Prostate disorder     PAST SURGICAL HISTORY:  Past Surgical History:  Procedure Laterality Date  . PROSTATE SURGERY      SOCIAL HISTORY:  Social History   Tobacco Use  . Smoking status: Former Research scientist (life sciences)  . Smokeless tobacco: Never Used  Substance Use Topics  . Alcohol use: Yes    FAMILY HISTORY: No family history on file.  DRUG ALLERGIES: No Known Allergies  REVIEW OF SYSTEMS:   CONSTITUTIONAL: No fever, fatigue or weakness.  EYES: No blurred or double vision.  EARS, NOSE, AND THROAT: No tinnitus or ear pain.  RESPIRATORY: No cough, shortness of breath, wheezing or hemoptysis.  CARDIOVASCULAR: No chest pain, orthopnea, edema.  GASTROINTESTINAL: No nausea, vomiting, diarrhea or abdominal pain.  GENITOURINARY: No dysuria, hematuria.  ENDOCRINE: No polyuria, nocturia,  HEMATOLOGY: No anemia, easy bruising or bleeding SKIN: No rash or lesion. MUSCULOSKELETAL: Right shoulder and lower back pain. NEUROLOGIC: No tingling, numbness, weakness.  PSYCHIATRY: No anxiety or depression.   MEDICATIONS AT HOME:  Prior to Admission medications   Medication Sig Start Date End Date Taking? Authorizing Provider  aspirin 81 MG tablet Take 1 tablet by mouth daily. 08/06/12  Yes [provider]  naproxen sodium (ALEVE) 220 MG tablet Take 220 mg by mouth 2 (two) times daily as needed.   Yes [provider]  tamsulosin (FLOMAX) 0.4 MG CAPS capsule Take 4 mg by mouth daily. 03/13/18  Yes [provider]      PHYSICAL EXAMINATION:   VITAL SIGNS: Blood pressure (!) 144/61, pulse (!) 50, temperature 97.7 F (36.5 C), temperature source Oral, resp. rate (!) 22, height 5\' 9"  (1.753 m), weight 73.5 kg (162 lb), SpO2 96 %.  GENERAL:  84 y.o.-year-old patient lying in the bed with no acute distress.  EYES: Pupils equal, round, reactive to light and accommodation. No scleral icterus. Extraocular muscles intact.  HEENT:  Head atraumatic, normocephalic. Oropharynx and nasopharynx clear.  NECK:  Supple, no jugular venous distention. No thyroid enlargement, no tenderness.  LUNGS: Normal breath sounds bilaterally, no wheezing, rales,rhonchi or crepitation. No use of accessory muscles of respiration.  CARDIOVASCULAR: S1, S2 normal. No S3/S4.  ABDOMEN: Soft, nontender, nondistended. Bowel sounds present. No organomegaly or mass.  EXTREMITIES: No pedal edema, cyanosis, or clubbing.  NEUROLOGIC: No focal weakness. PSYCHIATRIC: The patient is alert and oriented x 3.  SKIN: No obvious rash, lesion, or ulcer.   LABORATORY PANEL:   CBC Recent Labs  Lab 05/11/18 2059  WBC 11.9*  HGB 13.7  HCT 39.7*  PLT 171  MCV 89.9  MCH 31.0  MCHC 34.5  RDW 14.5  LYMPHSABS 0.5*  MONOABS 0.8  EOSABS 0.0  BASOSABS 0.0   ------------------------------------------------------------------------------------------------------------------  Chemistries  Recent Labs  Lab 05/11/18 2059  NA 133*  K 3.4*  CL 100  CO2 25  GLUCOSE 155*  BUN 17  CREATININE 0.95  CALCIUM 8.5*  AST 34  ALT 17  ALKPHOS 30*  BILITOT 1.2   ------------------------------------------------------------------------------------------------------------------ estimated creatinine clearance is 50.6 mL/min (by C-G formula based on SCr of 0.95 mg/dL). ------------------------------------------------------------------------------------------------------------------ No results for input(s): TSH, T4TOTAL, T3FREE, THYROIDAB in the last 72 hours.  Invalid input(s): FREET3   Coagulation profile No results for input(s): INR, PROTIME in the last 168 hours. ------------------------------------------------------------------------------------------------------------------- No results for input(s): DDIMER in the last 72 hours. -------------------------------------------------------------------------------------------------------------------  Cardiac  Enzymes Recent Labs  Lab 05/11/18 2059  TROPONINI 0.08*   ------------------------------------------------------------------------------------------------------------------ Invalid input(s): POCBNP  ---------------------------------------------------------------------------------------------------------------  Urinalysis No results found for: COLORURINE, APPEARANCEUR, LABSPEC, PHURINE, GLUCOSEU, HGBUR, BILIRUBINUR, KETONESUR, PROTEINUR, UROBILINOGEN, NITRITE, LEUKOCYTESUR   RADIOLOGY: Dg Lumbar Spine 2-3 Views  Result Date: 05/11/2018 CLINICAL DATA:  Fall with low back pain EXAM: LUMBAR SPINE - 2-3 VIEW COMPARISON:  04/10/2008 FINDINGS: Five non rib-bearing lumbar type vertebra. SI joints are patent. Vertebral body heights are normal. Flowing osteophytes over the lumbar spine. Mild to moderate degenerative changes at L1-L2 and L2-L3 with mild degenerative change at L3 through S1. Aortic calcification IMPRESSION: Degenerative changes.  No acute osseous abnormality. Electronically Signed   By: Donavan Foil M.D.   On: 05/11/2018 21:31   Dg Shoulder Right  Result Date: 05/11/2018 CLINICAL DATA:  Fall with pain in the right shoulder EXAM: RIGHT SHOULDER - 2+ VIEW COMPARISON:  None. FINDINGS: AC joint is intact. The right lung apex is clear. No dislocation. High-riding humeral head consistent with rotator cuff disease. Age indeterminate fracture at the inferior glenoid rim. Old appearing right fourth rib fracture. IMPRESSION: 1. No dislocation. 2. Age indeterminate fracture off the inferior glenoid rim. 3. High-riding humeral head suggesting rotator cuff disease Electronically Signed   By: Donavan Foil M.D.   On: 05/11/2018 21:30   Ct Head Wo Contrast  Result Date: 05/11/2018 CLINICAL DATA:  Unwitnessed mechanical fall EXAM: CT HEAD WITHOUT CONTRAST TECHNIQUE: Contiguous axial images were obtained from the base of the skull through the vertex without intravenous contrast. COMPARISON:  None.  FINDINGS: Brain: No  acute territorial infarction, hemorrhage or intracranial mass. Marked atrophy. Moderate severe small vessel ischemic changes of the white matter. Enlarged ventricles, felt secondary to atrophy. Vascular: No hyperdense vessels.  Carotid vascular calcification. Skull: Normal. Negative for fracture or focal lesion. Sinuses/Orbits: No acute finding. Other: None IMPRESSION: 1. No CT evidence for acute intracranial abnormality. 2. Significant atrophy and small vessel ischemic changes of the white matter Electronically Signed   By: Donavan Foil M.D.   On: 05/11/2018 21:38    EKG: Orders placed or performed during the hospital encounter of 05/11/18  . EKG 12-Lead  . EKG 12-Lead  . EKG 12-Lead  . EKG 12-Lead    IMPRESSION AND PLAN:  1.  Mechanical fall, secondary to unstable gait.  Will have PT/OT evaluate the patient. 2.  Elevated troponin level and elevated CPK level, status post fall.  Will monitor patient on telemetry and follow troponin levels to rule out ACS. 3.  Right shoulder pain likely secondary to rotator cuff tendinitis, worsened by the fall.  Will continue pain management and have orthopedics evaluate the patient. 4.  Hypokalemia.  Will replace potassium per protocol.  All the records are reviewed and case discussed with ED provider. Management plans discussed with the patient, family and they are in agreement.  CODE STATUS: Full    TOTAL TIME TAKING CARE OF THIS PATIENT: 45 minutes.    Amelia Jo M.D on 05/12/2018 at 12:26 AM  Between 7am to 6pm - Pager - [phone]  After 6pm go to [email or web address] - password [email or web address] [facility]  Office  [phone]  CC: Primary care physician; [name], MD
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
## 2018-05-12 NOTE — Consult Note (Signed)
Henry [name] is a 84 y.o. male  [id number]  Primary Cardiologist: Neoma Laming Reason for Consultation: Syncope  HPI: This is a 84 year old white male with a history of BPH presented to the hospital after falling down and blood pressure was 92/60. Patient developed elevated enzymes with troponin 0.08 thus I was asked to evaluate the patient. Patient denies any chest pain but has some left shoulder pain. No orthopnea or leg swelling   Review of Systems: No dizziness at this time   Past Medical History:  Diagnosis Date  . Prostate disorder     Medications Prior to Admission  Medication Sig Dispense Refill  . aspirin 81 MG tablet Take 1 tablet by mouth daily.    . naproxen sodium (ALEVE) 220 MG tablet Take 220 mg by mouth 2 (two) times daily as needed.    . tamsulosin (FLOMAX) 0.4 MG CAPS capsule Take 0.4 mg by mouth daily.        Marland Kitchen aspirin EC  81 mg Oral Daily  . docusate sodium  100 mg Oral BID  . finasteride  5 mg Oral Daily  . heparin  5,000 Units Subcutaneous Q8H  . tamsulosin  0.4 mg Oral Daily    Infusions: . sodium chloride 50 mL/hr at 05/12/18 1111    No Known Allergies  Social History   Socioeconomic History  . Marital status: Married    Spouse name: Not on file  . Number of children: Not on file  . Years of education: Not on file  . Highest education level: Not on file  Occupational History  . Not on file  Social Needs  . Financial resource strain: Not on file  . Food insecurity:    Worry: Not on file    Inability: Not on file  . Transportation needs:    Medical: Not on file    Non-medical: Not on file  Tobacco Use  . Smoking status: Former Research scientist (life sciences)  . Smokeless tobacco: Never Used  Substance and Sexual Activity  . Alcohol use: Yes  . Drug use: Not on file  . Sexual activity: Not on file  Lifestyle  . Physical activity:    Days per week: Not on file    Minutes per session: Not on file  . Stress: Not on file  Relationships  . Social  connections:    Talks on phone: Not on file    Gets together: Not on file    Attends religious service: Not on file    Active member of club or organization: Not on file    Attends meetings of clubs or organizations: Not on file    Relationship status: Not on file  . Intimate partner violence:    Fear of current or ex partner: Not on file    Emotionally abused: Not on file    Physically abused: Not on file    Forced sexual activity: Not on file  Other Topics Concern  . Not on file  Social History Narrative  . Not on file    History reviewed. No pertinent family history.  PHYSICAL EXAM: Vitals:   05/12/18 0640 05/12/18 0801  BP: (!) 167/73 (!) 158/67  Pulse: (!) 50 (!) 49  Resp: 19 20  Temp: 98.1 F (36.7 C)   SpO2: 98% 99%     Intake/Output Summary (Last 24 hours) at 05/12/2018 1137 Last data filed at 05/12/2018 1007 Gross per 24 hour  Intake 490 ml  Output -  Net 490 ml  General:  Well appearing. No respiratory difficulty HEENT: normal Neck: supple. no JVD. Carotids 2+ bilat; no bruits. No lymphadenopathy or thryomegaly appreciated. Cor: PMI nondisplaced. Regular rate & rhythm. No rubs, gallops or murmurs. Lungs: clear Abdomen: soft, nontender, nondistended. No hepatosplenomegaly. No bruits or masses. Good bowel sounds. Extremities: no cyanosis, clubbing, rash, edema Neuro: alert & oriented x 3, cranial nerves grossly intact. moves all 4 extremities w/o difficulty. Affect pleasant.  ECG: Sinus rhythm with no acute changes  Results for orders placed or performed during the hospital encounter of 05/11/18 (from the past 24 hour(s))  CK     Status: Abnormal   Collection Time: 05/11/18  8:59 PM  Result Value Ref Range   Total CK 441 (H) 49 - 397 U/L  CBC with Differential     Status: Abnormal   Collection Time: 05/11/18  8:59 PM  Result Value Ref Range   WBC 11.9 (H) 3.8 - 10.6 K/uL   RBC 4.41 4.40 - 5.90 MIL/uL   Hemoglobin 13.7 13.0 - 18.0 g/dL   HCT 39.7 (L)  40.0 - 52.0 %   MCV 89.9 80.0 - 100.0 fL   MCH 31.0 26.0 - 34.0 pg   MCHC 34.5 32.0 - 36.0 g/dL   RDW 14.5 11.5 - 14.5 %   Platelets 171 150 - 440 K/uL   Neutrophils Relative % 88 %   Neutro Abs 10.5 (H) 1.4 - 6.5 K/uL   Lymphocytes Relative 5 %   Lymphs Abs 0.5 (L) 1.0 - 3.6 K/uL   Monocytes Relative 7 %   Monocytes Absolute 0.8 0.2 - 1.0 K/uL   Eosinophils Relative 0 %   Eosinophils Absolute 0.0 0 - 0.7 K/uL   Basophils Relative 0 %   Basophils Absolute 0.0 0 - 0.1 K/uL  Comprehensive metabolic panel     Status: Abnormal   Collection Time: 05/11/18  8:59 PM  Result Value Ref Range   Sodium 133 (L) 135 - 145 mmol/L   Potassium 3.4 (L) 3.5 - 5.1 mmol/L   Chloride 100 98 - 111 mmol/L   CO2 25 22 - 32 mmol/L   Glucose, Bld 155 (H) 70 - 99 mg/dL   BUN 17 8 - 23 mg/dL   Creatinine, Ser 0.95 0.61 - 1.24 mg/dL   Calcium 8.5 (L) 8.9 - 10.3 mg/dL   Total Protein 5.7 (L) 6.5 - 8.1 g/dL   Albumin 3.3 (L) 3.5 - 5.0 g/dL   AST 34 15 - 41 U/L   ALT 17 0 - 44 U/L   Alkaline Phosphatase 30 (L) 38 - 126 U/L   Total Bilirubin 1.2 0.3 - 1.2 mg/dL   GFR calc non Af Amer >60 >60 mL/min   GFR calc Af Amer >60 >60 mL/min   Anion gap 8 5 - 15  Troponin I     Status: Abnormal   Collection Time: 05/11/18  8:59 PM  Result Value Ref Range   Troponin I 0.08 (HH) <0.03 ng/mL  Basic metabolic panel     Status: Abnormal   Collection Time: 05/12/18  5:48 AM  Result Value Ref Range   Sodium 134 (L) 135 - 145 mmol/L   Potassium 3.8 3.5 - 5.1 mmol/L   Chloride 100 98 - 111 mmol/L   CO2 27 22 - 32 mmol/L   Glucose, Bld 102 (H) 70 - 99 mg/dL   BUN 16 8 - 23 mg/dL   Creatinine, Ser 0.74 0.61 - 1.24 mg/dL   Calcium 8.4 (L) 8.9 -  10.3 mg/dL   GFR calc non Af Amer >60 >60 mL/min   GFR calc Af Amer >60 >60 mL/min   Anion gap 7 5 - 15  CBC     Status: Abnormal   Collection Time: 05/12/18  5:48 AM  Result Value Ref Range   WBC 7.6 3.8 - 10.6 K/uL   RBC 4.36 (L) 4.40 - 5.90 MIL/uL   Hemoglobin 13.6  13.0 - 18.0 g/dL   HCT 39.2 (L) 40.0 - 52.0 %   MCV 89.9 80.0 - 100.0 fL   MCH 31.3 26.0 - 34.0 pg   MCHC 34.8 32.0 - 36.0 g/dL   RDW 14.1 11.5 - 14.5 %   Platelets 162 150 - 440 K/uL  Troponin I     Status: Abnormal   Collection Time: 05/12/18  5:48 AM  Result Value Ref Range   Troponin I 0.16 (HH) <0.03 ng/mL  Glucose, capillary     Status: None   Collection Time: 05/12/18  7:39 AM  Result Value Ref Range   Glucose-Capillary 87 70 - 99 mg/dL   Comment 1 Notify RN    Dg Lumbar Spine 2-3 Views  Result Date: 05/11/2018 CLINICAL DATA:  Fall with low back pain EXAM: LUMBAR SPINE - 2-3 VIEW COMPARISON:  04/10/2008 FINDINGS: Five non rib-bearing lumbar type vertebra. SI joints are patent. Vertebral body heights are normal. Flowing osteophytes over the lumbar spine. Mild to moderate degenerative changes at L1-L2 and L2-L3 with mild degenerative change at L3 through S1. Aortic calcification IMPRESSION: Degenerative changes.  No acute osseous abnormality. Electronically Signed   By: Donavan Foil M.D.   On: 05/11/2018 21:31   Dg Shoulder Right  Result Date: 05/11/2018 CLINICAL DATA:  Fall with pain in the right shoulder EXAM: RIGHT SHOULDER - 2+ VIEW COMPARISON:  None. FINDINGS: AC joint is intact. The right lung apex is clear. No dislocation. High-riding humeral head consistent with rotator cuff disease. Age indeterminate fracture at the inferior glenoid rim. Old appearing right fourth rib fracture. IMPRESSION: 1. No dislocation. 2. Age indeterminate fracture off the inferior glenoid rim. 3. High-riding humeral head suggesting rotator cuff disease Electronically Signed   By: Donavan Foil M.D.   On: 05/11/2018 21:30   Ct Head Wo Contrast  Result Date: 05/11/2018 CLINICAL DATA:  Unwitnessed mechanical fall EXAM: CT HEAD WITHOUT CONTRAST TECHNIQUE: Contiguous axial images were obtained from the base of the skull through the vertex without intravenous contrast. COMPARISON:  None. FINDINGS: Brain: No  acute territorial infarction, hemorrhage or intracranial mass. Marked atrophy. Moderate severe small vessel ischemic changes of the white matter. Enlarged ventricles, felt secondary to atrophy. Vascular: No hyperdense vessels.  Carotid vascular calcification. Skull: Normal. Negative for fracture or focal lesion. Sinuses/Orbits: No acute finding. Other: None IMPRESSION: 1. No CT evidence for acute intracranial abnormality. 2. Significant atrophy and small vessel ischemic changes of the white matter Electronically Signed   By: Donavan Foil M.D.   On: 05/11/2018 21:38     ASSESSMENT AND PLAN: Mildly elevated troponin due to demand ischemia with possible coronary artery disease. Discussed with daughter about cardiac catheterization and other scenarios such as medical treatment given patient's age. Option of medical treatment is probably the right choice. Advise giving the patient aspirin 81 mg Lipitor 20 mg and isosorbide 30 mg once a day with follow-up in the office Tuesday at 10:00. Advise replacing potassium to at least 4.0.  Martisha Toulouse A

## 2018-05-12 NOTE — Progress Notes (Signed)
OT Cancellation Note  Patient Details Name: LONNY EISEN MRN: [id number] DOB: April 23, 1925   Cancelled Treatment:     Consult received and pt chart reviewed. Noted cardiology note, and chart reviewed elevated troponins, waiting to see results of echo to proceed with OT service initiation. Will continue as pt available and medically appropriate to evaluate pt for OT services.  Zenovia Jarred, [place], OTR/L [phone]  Zenovia Jarred 05/12/2018, 3:09 PM

## 2018-05-13 LAB — LIPID PANEL
Cholesterol: 124 mg/dL (ref 0–200)
HDL: 41 mg/dL (ref >40)
LDL Cholesterol: 73 mg/dL (ref 0–99)
Total CHOL/HDL Ratio: 3 RATIO
Triglycerides: 51 mg/dL (ref <150)
VLDL: 10 mg/dL (ref 0–40)

## 2018-05-13 LAB — BASIC METABOLIC PANEL
Anion gap: 5 (ref 5–15)
BUN: 12 mg/dL (ref 8–23)
CALCIUM: 8.4 mg/dL — AB (ref 8.9–10.3)
CO2: 26 mmol/L (ref 22–32)
CREATININE: 0.75 mg/dL (ref 0.61–1.24)
Chloride: 104 mmol/L (ref 98–111)
GFR calc Af Amer: >60 mL/min (ref >60)
GFR calc non Af Amer: >60 mL/min (ref >60)
GLUCOSE: 89 mg/dL (ref 70–99)
POTASSIUM: 3.9 mmol/L (ref 3.5–5.1)
Sodium: 135 mmol/L (ref 135–145)

## 2018-05-13 LAB — CK: CK TOTAL: 2128 U/L — AB (ref 49–397)

## 2018-05-13 LAB — GLUCOSE, CAPILLARY: GLUCOSE-CAPILLARY: 109 mg/dL — AB (ref 70–99)

## 2018-05-13 LAB — HEMOGLOBIN: HEMOGLOBIN: 13.4 g/dL (ref 13.0–18.0)

## 2018-05-13 NOTE — Progress Notes (Signed)
SUBJECTIVE: patient is much more alert and oriented today and denies any chest pain or shortness of breath   Vitals:   05/12/18 0640 05/12/18 0801 05/12/18 1955 05/13/18 0730  BP: (!) 167/73 (!) 158/67 111/65 (!) 163/84  Pulse: (!) 50 (!) 49 62 67  Resp: 19 20 18 18   Temp: 98.1 F (36.7 C)  99.2 F (37.3 C) 98.4 F (36.9 C)  TempSrc: Oral  Oral Oral  SpO2: 98% 99% 97% 97%  Weight:   166 lb 3.2 oz (75.4 kg)   Height:        Intake/Output Summary (Last 24 hours) at 05/13/2018 1220 Last data filed at 05/13/2018 1007 Gross per 24 hour  Intake 1504.58 ml  Output 0 ml  Net 1504.58 ml    LABS: Basic Metabolic Panel: Recent Labs    05/12/18 0548 05/13/18 0430  NA 134* 135  K 3.8 3.9  CL 100 104  CO2 27 26  GLUCOSE 102* 89  BUN 16 12  CREATININE 0.74 0.75  CALCIUM 8.4* 8.4*   Liver Function Tests: Recent Labs    05/11/18 2059  AST 34  ALT 17  ALKPHOS 30*  BILITOT 1.2  PROT 5.7*  ALBUMIN 3.3*   No results for input(s): LIPASE, AMYLASE in the last 72 hours. CBC: Recent Labs    05/11/18 2059 05/12/18 0548 05/13/18 0430  WBC 11.9* 7.6  --   NEUTROABS 10.5*  --   --   HGB 13.7 13.6 13.4  HCT 39.7* 39.2*  --   MCV 89.9 89.9  --   PLT 171 162  --    Cardiac Enzymes: Recent Labs    05/11/18 2059 05/12/18 0548 05/12/18 1421 05/13/18 0430  CKTOTAL 441*  --   --  2,128*  TROPONINI 0.08* 0.16* 0.12*  --    BNP: Invalid input(s): POCBNP D-Dimer: No results for input(s): DDIMER in the last 72 hours. Hemoglobin A1C: No results for input(s): HGBA1C in the last 72 hours. Fasting Lipid Panel: Recent Labs    05/13/18 0430  CHOL 124  HDL 41  LDLCALC 73  TRIG 51  CHOLHDL 3.0   Thyroid Function Tests: No results for input(s): TSH, T4TOTAL, T3FREE, THYROIDAB in the last 72 hours.  Invalid input(s): FREET3 Anemia Panel: No results for input(s): VITAMINB12, FOLATE, FERRITIN, TIBC, IRON, RETICCTPCT in the last 72 hours.   PHYSICAL EXAM General: Well  developed, well nourished, in no acute distress HEENT:  Normocephalic and atramatic Neck:  No JVD.  Lungs: Clear bilaterally to auscultation and percussion. Heart: HRRR . Normal S1 and S2 without gallops or murmurs.  Abdomen: Bowel sounds are positive, abdomen soft and non-tender  Msk:  Back normal, normal gait. Normal strength and tone for age. Extremities: No clubbing, cyanosis or edema.   Neuro: Alert and oriented X 3. Psych:  Good affect, responds appropriately  TELEMETRY: normal sinus rhythm  ASSESSMENT AND PLAN: mildly elevated troponin secondary to fall with elevated CPK suggestive of rhabdomyolysis. Patient is getting IV fluid and is not a candidate for revascularization procedures such as cardiac catheterization. We will treat the patient conservatively.patient has probably presumed coronary artery disease and will continue aspirin and isosorbide. Follow-up with [facility] cardiology.  Active Problems:   NSTEMI (non-ST elevated myocardial infarction) ([place])    Neoma Laming A, MD, [facility] 05/13/2018 12:20 PM

## 2018-05-13 NOTE — Evaluation (Signed)
Physical Therapy Evaluation Patient Details Name: Henry [name] MRN: [id number] DOB: 06-04-1925 Today's Date: 05/13/2018   History of Present Illness  Pt presents to ED on 05/11/18 after fall. Pt diagnosed with R RTC tear and glenoid chip fracture. Ortho consult states that pts R shld is non operative. Pt also displayed elevated troponin which cardio consult states is due to demand eschemia. Pt has a past medical history that includes prostate disorder.    Clinical Impression  Pt is a pleasant 84 year old male who was admitted for R rotator cuff tear and glenoid chip fracture as well as elevated troponin secondary to demand eschemia. Pt performs transfers and ambulation modified independent to [place]. Pt demonstrates deficits with strength, DME use, and unsteadiness on feet. Pts demonstrates equal bilateral and WFL strength/ROM except for R shoulder which pt demonstrates drastic loss of ROM. Pt very active using SPC at baseline, uses in R UE. Pt instructed pt in LE strengthening exercises which he tolerated well. Pt educated in RW use prior to amb 200' using RW which he states is not painful in R shoulder. Pt requires continued cuing for upright posture, DME use, and to slow cadence. Pt demonstrates ability to use RW safely. Pts family states that gait performance with RW is similar to that with [facility]. Pt did not demonstrate any LOB during gait. PT plans to order RW to decrease pain while maintaining mobility. PT plans to order OT consult to further assess limitations R shoulder will have on ADLs. Pt could benefit from continued skilled therapy at this time to improve deficits toward PLOF. PT will continue to work with pt at least 2x/week while admitted. D/c recommendations at this time are home with home health PT for further gait training. Son used to assist with translation during session.      Follow Up Recommendations Home health PT    Equipment Recommendations  Rolling walker with 5" wheels     Recommendations for Other Services OT consult     Precautions / Restrictions Precautions Precautions: None Restrictions Weight Bearing Restrictions: No      Mobility  Bed Mobility               [name] bed mobility comments: Not assessed this visit due to pt being up with nursing upon arrival and being left in chair upon finishing of session  Transfers Overall transfer level: Modified independent Equipment used: Rolling walker (2 wheeled)             [name] transfer comment: modified independent to RW. able to complete in one attempt with appropriate UE use.  Ambulation/Gait Ambulation/Gait assistance: Min guard Gait Distance (Feet): 200 Feet Assistive device: Rolling walker (2 wheeled) Gait Pattern/deviations: Trunk flexed;Step-to pattern Gait velocity: Requires cuing to slow down      Stairs            Wheelchair Mobility    Modified Rankin (Stroke Patients Only)       Balance Overall balance assessment: Needs assistance   Sitting balance-Leahy Scale: Good Sitting balance - Comments: sits without extremity support without LOB     Standing balance-Leahy Scale: Fair Standing balance comment: Uses UE support to maintain balance in standing. able to stand without UE support for short periods of time                             Pertinent Vitals/Pain Pain Assessment: 0-10 Pain Score: 2  Pain Location:  low back and R shoulder Pain Descriptors / Indicators: Aching;Discomfort Pain Intervention(s): Limited activity within patient's tolerance;Monitored during session;Repositioned    Home Living Family/patient expects to be discharged to:: Private residence Living Arrangements: Alone Available Help at Discharge: Family Type of Home: House Home Access: Ramped entrance     Home Layout: Two level;Able to live on main level with bedroom/bathroom(pt at baseline stays on first level) Home Equipment: Cane - single point      Prior  Function Level of Independence: Independent with assistive device(s)         Comments: Pt is very independent at baseline using SPC. 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 Shoulder unable to fully assess due to pain. Limited ROM secondary to pain. LUE Deficits / Details: Grossly at least 4/5    Lower Extremity Assessment Lower Extremity Assessment: RLE deficits/detail;LLE deficits/detail RLE Deficits / Details: Equal bilateral grossly 4/5 LLE Deficits / Details: Equal bilateral grossly 4/5       Communication   Communication: Prefers language other than English(Greek)  Cognition Arousal/Alertness: Awake/alert Behavior During Therapy: WFL for tasks assessed/performed Overall Cognitive Status: Difficult to assess                                        [name] Comments      Exercises Other Exercises Other Exercises: instructed in seated exercise including seated marching, SLR, LAQ bilaterally x10.   Assessment/Plan    PT Assessment Patient needs continued PT services  PT Problem List Decreased strength;Decreased range of motion;Decreased activity tolerance;Decreased mobility;Decreased balance;Decreased coordination;Decreased cognition;Decreased knowledge of use of DME;Decreased safety awareness;Pain       PT Treatment Interventions DME instruction;Gait training;Stair training;Functional mobility training;Therapeutic activities;Therapeutic exercise;Balance training;Neuromuscular re-education;Patient/family education    PT Goals (Current goals can be found in the Care Plan section)  Acute Rehab PT Goals Patient Stated Goal: to get back to work PT Goal Formulation: With patient Time For Goal Achievement:  05/27/18 Potential to Achieve Goals: Fair    Frequency Min 2X/week   Barriers to discharge        Co-evaluation               AM-PAC PT "6 Clicks" Daily Activity  Outcome Measure Difficulty turning over in bed (including adjusting bedclothes, sheets and blankets)?: None Difficulty moving from lying on back to sitting on the side of the bed? : None Difficulty sitting down on and standing up from a chair with arms (e.g., wheelchair, bedside commode, etc,.)?: None Help needed moving to and from a bed to chair (including a wheelchair)?: A Little Help needed walking in hospital room?: A Little Help needed climbing 3-5 steps with a railing? : A Little 6 Click Score: 21    End of Session Equipment Utilized During Treatment: Gait belt Activity Tolerance: Patient tolerated treatment well Patient left: in chair;with call bell/phone within reach;with chair alarm set Nurse Communication: Mobility status PT Visit Diagnosis: Unsteadiness on feet (R26.81);Other abnormalities of gait and mobility (R26.89);Muscle weakness (generalized) (M62.81);History of falling (Z91.81);Difficulty in walking, not elsewhere classified (R26.2);Pain Pain - Right/Left: Right Pain - part of body: Shoulder    Time: 6433-2951 PT Time Calculation (  min) (ACUTE ONLY): 30 min   Charges:         PT G Codes:        Floetta Brickey, SPT   Tymira Horkey 05/13/2018, 11:31 AM

## 2018-05-13 NOTE — Progress Notes (Signed)
Patient ID: Henry [name], male   DOB: 1925/05/23, 84 y.o.   MRN: [id number]  [facility] PROGRESS NOTE  Henry [name] EXB:[id number] DOB: 08/18/1925 DOA: 05/11/2018 PCP: [name], MD  HPI/Subjective: Patient feels okay.  Walked around.  Stated still has low back pain and right shoulder pain.  Objective: Vitals:   05/12/18 1955 05/13/18 0730  BP: 111/65 (!) 163/84  Pulse: 62 67  Resp: 18 18  Temp: 99.2 F (37.3 C) 98.4 F (36.9 C)  SpO2: 97% 97%    Intake/Output Summary (Last 24 hours) at 05/13/2018 1324 Last data filed at 05/13/2018 1007 Gross per 24 hour  Intake 1504.58 ml  Output 0 ml  Net 1504.58 ml   Filed Weights   05/11/18 2052 05/12/18 0106 05/12/18 1955  Weight: 73.5 kg (162 lb) 75.6 kg (166 lb 9.6 oz) 75.4 kg (166 lb 3.2 oz)    ROS: Review of Systems  Constitutional: Negative for chills and fever.  Eyes: Negative for blurred vision.  Respiratory: Negative for cough and shortness of breath.   Cardiovascular: Negative for chest pain.  Gastrointestinal: Negative for abdominal pain, constipation, diarrhea, nausea and vomiting.  Genitourinary: Positive for urgency. Negative for dysuria.  Musculoskeletal: Positive for back pain and joint pain.  Neurological: Negative for dizziness and headaches.   Exam: Physical Exam  HENT:  Nose: No mucosal edema.  Mouth/Throat: No oropharyngeal exudate or posterior oropharyngeal edema.  Eyes: Pupils are equal, round, and reactive to light. Conjunctivae, EOM and lids are normal.  Neck: No JVD present. Carotid bruit is not present. No edema present. No thyroid mass and no thyromegaly present.  Cardiovascular: S1 normal and S2 normal. Exam reveals no gallop.  Murmur heard.  Systolic murmur is present with a grade of 2/6. Pulses:      Dorsalis pedis pulses are 2+ on the right side, and 2+ on the left side.  Respiratory: No respiratory distress. He has no wheezes. He has no rhonchi. He has no rales.  GI: Soft. Bowel  sounds are normal. There is no tenderness.  Musculoskeletal:       Right ankle: He exhibits swelling.       Left ankle: He exhibits swelling.  Lymphadenopathy:    He has no cervical adenopathy.  Neurological: He is alert. No cranial nerve deficit.  Skin: Skin is warm. No rash noted. Nails show no clubbing.  Psychiatric: He has a normal mood and affect.      Data Reviewed: Basic Metabolic Panel: Recent Labs  Lab 05/11/18 2059 05/12/18 0548 05/13/18 0430  NA 133* 134* 135  K 3.4* 3.8 3.9  CL 100 100 104  CO2 25 27 26   GLUCOSE 155* 102* 89  BUN 17 16 12   CREATININE 0.95 0.74 0.75  CALCIUM 8.5* 8.4* 8.4*   Liver Function Tests: Recent Labs  Lab 05/11/18 2059  AST 34  ALT 17  ALKPHOS 30*  BILITOT 1.2  PROT 5.7*  ALBUMIN 3.3*   CBC: Recent Labs  Lab 05/11/18 2059 05/12/18 0548 05/13/18 0430  WBC 11.9* 7.6  --   NEUTROABS 10.5*  --   --   HGB 13.7 13.6 13.4  HCT 39.7* 39.2*  --   MCV 89.9 89.9  --   PLT 171 162  --    Cardiac Enzymes: Recent Labs  Lab 05/11/18 2059 05/12/18 0548 05/12/18 1421 05/13/18 0430  CKTOTAL 441*  --   --  2,128*  TROPONINI 0.08* 0.16* 0.12*  --     CBG:  Recent Labs  Lab 05/12/18 0739 05/12/18 1144 05/13/18 0841  GLUCAP 87 106* 109*     Studies: Dg Lumbar Spine 2-3 Views  Result Date: 05/11/2018 CLINICAL DATA:  Fall with low back pain EXAM: LUMBAR SPINE - 2-3 VIEW COMPARISON:  04/10/2008 FINDINGS: Five non rib-bearing lumbar type vertebra. SI joints are patent. Vertebral body heights are normal. Flowing osteophytes over the lumbar spine. Mild to moderate degenerative changes at L1-L2 and L2-L3 with mild degenerative change at L3 through S1. Aortic calcification IMPRESSION: Degenerative changes.  No acute osseous abnormality. Electronically Signed   By: Donavan Foil M.D.   On: 05/11/2018 21:31   Dg Shoulder Right  Result Date: 05/11/2018 CLINICAL DATA:  Fall with pain in the right shoulder EXAM: RIGHT SHOULDER - 2+ VIEW  COMPARISON:  None. FINDINGS: AC joint is intact. The right lung apex is clear. No dislocation. High-riding humeral head consistent with rotator cuff disease. Age indeterminate fracture at the inferior glenoid rim. Old appearing right fourth rib fracture. IMPRESSION: 1. No dislocation. 2. Age indeterminate fracture off the inferior glenoid rim. 3. High-riding humeral head suggesting rotator cuff disease Electronically Signed   By: Donavan Foil M.D.   On: 05/11/2018 21:30   Ct Head Wo Contrast  Result Date: 05/11/2018 CLINICAL DATA:  Unwitnessed mechanical fall EXAM: CT HEAD WITHOUT CONTRAST TECHNIQUE: Contiguous axial images were obtained from the base of the skull through the vertex without intravenous contrast. COMPARISON:  None. FINDINGS: Brain: No acute territorial infarction, hemorrhage or intracranial mass. Marked atrophy. Moderate severe small vessel ischemic changes of the white matter. Enlarged ventricles, felt secondary to atrophy. Vascular: No hyperdense vessels.  Carotid vascular calcification. Skull: Normal. Negative for fracture or focal lesion. Sinuses/Orbits: No acute finding. Other: None IMPRESSION: 1. No CT evidence for acute intracranial abnormality. 2. Significant atrophy and small vessel ischemic changes of the white matter Electronically Signed   By: Donavan Foil M.D.   On: 05/11/2018 21:38    Scheduled Meds: . aspirin EC  81 mg Oral Daily  . docusate sodium  100 mg Oral BID  . finasteride  5 mg Oral Daily  . heparin  5,000 Units Subcutaneous Q8H  . isosorbide mononitrate  30 mg Oral Daily  . tamsulosin  0.4 mg Oral Daily   Continuous Infusions: . sodium chloride 75 mL/hr at 05/13/18 0913    Assessment/Plan:  1. Acute rhabdomyolysis.  CPK went up over 2000 today.  Elevated troponin likely secondary to rhabdomyolysis.  Increase IV fluids to 75 cc an hour.  Recheck CPK tomorrow morning. 2. Elevated troponin secondary to rhabdomyolysis and demand ischemia. 3. BPH with  urinating every hour.  Added finasteride to Flomax. 4. Chronic back pain 5. Right shoulder pain.  Orthopedic surgery recommended NSAIDs but I would rather hold off on that until rhabdomyolysis settles down a little bit.  Code Status:     Code Status Orders  (From admission, onward)        Start     Ordered   05/12/18 1045  Do not attempt resuscitation (DNR)  Continuous    Question Answer Comment  In the event of cardiac or respiratory ARREST Do not call a "code blue"   In the event of cardiac or respiratory ARREST Do not perform Intubation, CPR, defibrillation or ACLS   In the event of cardiac or respiratory ARREST Use medication by any route, position, wound care, and other measures to relive pain and suffering. May use oxygen, suction and manual treatment of airway  obstruction as needed for comfort.   Comments nurse may pronounce      05/12/18 1045    Code Status History    Date Active Date Inactive Code Status Order ID Comments User Context   05/12/2018 0054 05/12/2018 1045 Full Code [id number]  Amelia Jo, MD ED    Advance Directive Documentation     Most Recent Value  Type of Advance Directive  Healthcare Power of Attorney  Pre-existing out of facility DNR order (yellow form or pink MOST form)  -  "MOST" Form in Place?  -     Family Communication: Spoke with son at the bedside Disposition Plan: Would like to see CPK come down prior to disposition Consultants:  Cardiology  Orthopedic surgery  Time spent: 28 minutes  [place]

## 2018-05-14 LAB — BASIC METABOLIC PANEL
Anion gap: 6 (ref 5–15)
BUN: 14 mg/dL (ref 8–23)
CHLORIDE: 102 mmol/L (ref 98–111)
CO2: 25 mmol/L (ref 22–32)
CREATININE: 0.68 mg/dL (ref 0.61–1.24)
Calcium: 8.3 mg/dL — ABNORMAL LOW (ref 8.9–10.3)
GFR calc non Af Amer: >60 mL/min (ref >60)
Glucose, Bld: 94 mg/dL (ref 70–99)
POTASSIUM: 4 mmol/L (ref 3.5–5.1)
SODIUM: 133 mmol/L — AB (ref 135–145)

## 2018-05-14 LAB — CK: Total CK: 1600 U/L — ABNORMAL HIGH (ref 49–397)

## 2018-05-14 LAB — GLUCOSE, CAPILLARY: GLUCOSE-CAPILLARY: 96 mg/dL (ref 70–99)

## 2018-05-14 MED ORDER — MELOXICAM 7.5 MG PO TABS
15.0000 mg | ORAL_TABLET | Freq: Every day | ORAL | Status: DC
  Administered 2018-05-14 – 2018-05-15 (×2): 15 mg via ORAL
  Filled 2018-05-14 (×2): qty 2

## 2018-05-14 NOTE — Progress Notes (Signed)
[facility] Encounter Note  Patient: CHRISTOPH COPELAN / Admit Date: 05/11/2018 / Date of Encounter: 05/14/2018, 7:06 AM   Subjective: Patient able to ambulate fairly well last night with concerns of urinary frequency.  No evidence of syncope unsteadiness or weakness.  Patient has not had any telemetry showing heart block or other significant rhythm disturbances.  No evidence of chest pain shortness of breath or congestive heart failure type symptoms. CPK elevation is improving with hydration   Review of Systems: Positive for: None Negative for: Vision change, hearing change, syncope, dizziness, nausea, vomiting,diarrhea, bloody stool, stomach pain, cough, congestion, diaphoresis, urinary frequency, urinary pain,skin lesions, skin rashes Others previously listed  Objective: Telemetry: Normal sinus rhythm Physical Exam: Blood pressure (!) 182/67, pulse (!) 54, temperature 97.8 F (36.6 C), temperature source Oral, resp. rate 18, height 5\' 8"  (1.727 m), weight 172 lb 9.6 oz (78.3 kg), SpO2 94 %. Body mass index is 26.24 kg/m. General: Well developed, well nourished, in no acute distress. Head: Normocephalic, atraumatic, sclera non-icteric, no xanthomas, nares are without discharge. Neck: No apparent masses Lungs: Normal respirations with no wheezes, no rhonchi, no rales , no crackles   Heart: Regular rate and rhythm, normal S1 S2, no murmur, no rub, no gallop, PMI is normal size and placement, carotid upstroke normal without bruit, jugular venous pressure normal Abdomen: Soft, non-tender, non-distended with normoactive bowel sounds. No hepatosplenomegaly. Abdominal aorta is normal size without bruit Extremities: Trace edema, no clubbing, no cyanosis, no ulcers,  Peripheral: 2+ radial, 2+ femoral, 2+ dorsal pedal pulses Neuro: Alert and oriented. Moves all extremities spontaneously. Psych:  Responds to questions appropriately with a normal affect.   Intake/Output Summary  (Last 24 hours) at 05/14/2018 0706 Last data filed at 05/13/2018 2346 Gross per 24 hour  Intake 1887.92 ml  Output 0 ml  Net 1887.92 ml    Inpatient Medications:  . aspirin EC  81 mg Oral Daily  . docusate sodium  100 mg Oral BID  . finasteride  5 mg Oral Daily  . heparin  5,000 Units Subcutaneous Q8H  . tamsulosin  0.4 mg Oral Daily   Infusions:  . sodium chloride 75 mL/hr at 05/13/18 0913    Labs: Recent Labs    05/12/18 0548 05/13/18 0430  NA 134* 135  K 3.8 3.9  CL 100 104  CO2 27 26  GLUCOSE 102* 89  BUN 16 12  CREATININE 0.74 0.75  CALCIUM 8.4* 8.4*   Recent Labs    05/11/18 2059  AST 34  ALT 17  ALKPHOS 30*  BILITOT 1.2  PROT 5.7*  ALBUMIN 3.3*   Recent Labs    05/11/18 2059 05/12/18 0548 05/13/18 0430  WBC 11.9* 7.6  --   NEUTROABS 10.5*  --   --   HGB 13.7 13.6 13.4  HCT 39.7* 39.2*  --   MCV 89.9 89.9  --   PLT 171 162  --    Recent Labs    05/11/18 2059 05/12/18 0548 05/12/18 1421 05/13/18 0430  CKTOTAL 441*  --   --  2,128*  TROPONINI 0.08* 0.16* 0.12*  --    Invalid input(s): POCBNP No results for input(s): HGBA1C in the last 72 hours.   Weights: Filed Weights   05/12/18 0106 05/12/18 1955 05/14/18 0402  Weight: 166 lb 9.6 oz (75.6 kg) 166 lb 3.2 oz (75.4 kg) 172 lb 9.6 oz (78.3 kg)     Radiology/Studies:  Dg Lumbar Spine 2-3 Views  Result Date:  05/11/2018 CLINICAL DATA:  Fall with low back pain EXAM: LUMBAR SPINE - 2-3 VIEW COMPARISON:  04/10/2008 FINDINGS: Five non rib-bearing lumbar type vertebra. SI joints are patent. Vertebral body heights are normal. Flowing osteophytes over the lumbar spine. Mild to moderate degenerative changes at L1-L2 and L2-L3 with mild degenerative change at L3 through S1. Aortic calcification IMPRESSION: Degenerative changes.  No acute osseous abnormality. Electronically Signed   By: Donavan Foil M.D.   On: 05/11/2018 21:31   Dg Shoulder Right  Result Date: 05/11/2018 CLINICAL DATA:  Fall with pain  in the right shoulder EXAM: RIGHT SHOULDER - 2+ VIEW COMPARISON:  None. FINDINGS: AC joint is intact. The right lung apex is clear. No dislocation. High-riding humeral head consistent with rotator cuff disease. Age indeterminate fracture at the inferior glenoid rim. Old appearing right fourth rib fracture. IMPRESSION: 1. No dislocation. 2. Age indeterminate fracture off the inferior glenoid rim. 3. High-riding humeral head suggesting rotator cuff disease Electronically Signed   By: Donavan Foil M.D.   On: 05/11/2018 21:30   Ct Head Wo Contrast  Result Date: 05/11/2018 CLINICAL DATA:  Unwitnessed mechanical fall EXAM: CT HEAD WITHOUT CONTRAST TECHNIQUE: Contiguous axial images were obtained from the base of the skull through the vertex without intravenous contrast. COMPARISON:  None. FINDINGS: Brain: No acute territorial infarction, hemorrhage or intracranial mass. Marked atrophy. Moderate severe small vessel ischemic changes of the white matter. Enlarged ventricles, felt secondary to atrophy. Vascular: No hyperdense vessels.  Carotid vascular calcification. Skull: Normal. Negative for fracture or focal lesion. Sinuses/Orbits: No acute finding. Other: None IMPRESSION: 1. No CT evidence for acute intracranial abnormality. 2. Significant atrophy and small vessel ischemic changes of the white matter Electronically Signed   By: Donavan Foil M.D.   On: 05/11/2018 21:38     Assessment and Recommendation  84 y.o. male with acute episode of fall with significant rhabdomyolysis and elevation of CPK now slightly improved with no further evidence of weakness heart failure or myocardial infarction.  Troponin elevation most consistent with rhabdomyolysis rather than acute coronary syndrome. 1.  Continue hydration until improved CPK elevation and rhabdomyolysis 2.  No additional diagnostic testing from the cardiovascular standpoint due to no evidence of myocardial infarction at this time or acute coronary syndrome 3.   Continue supportive care for fall and rehabilitation with reassessment of home care 4.  Ambulation today without restriction  Signed, Serafina Royals M.D. FACC

## 2018-05-14 NOTE — Progress Notes (Signed)
Patient's family has agreed to place bed alarm on while visitors are sleeping but refuses to use a low bed.

## 2018-05-14 NOTE — Care Management (Signed)
lives alone. Independent in his adls and still goes to family [employer] to work.  No issues accessing medical care, with transportation or paying for meds.  Home health PT and OT recommended.  Ortho consulted for right shoulder pain. 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  Not going to pursue surgical intervention. Shoulder immobilizer.  Patient also found to have elevated CK but is decreasing with IV fluids.

## 2018-05-14 NOTE — Progress Notes (Signed)
Patient ID: MARRIS FRONTERA, male   DOB: 06-06-1925, 84 y.o.   MRN: [id number]  [facility] PROGRESS NOTE  Henry [name] QMG:[id number] DOB: [birth year] DOA: 05/11/2018 PCP: [name], MD  HPI/Subjective: Patient feeling okay.  Still with frequent urination.  Has low back pain and right shoulder pain.  Objective: Vitals:   05/14/18 0402 05/14/18 0737  BP: (!) 182/67 (!) 149/70  Pulse: (!) 54 (!) 59  Resp:    Temp: 97.8 F (36.6 C) 99 F (37.2 C)  SpO2: 94% 97%    Filed Weights   05/12/18 0106 05/12/18 1955 05/14/18 0402  Weight: 75.6 kg (166 lb 9.6 oz) 75.4 kg (166 lb 3.2 oz) 78.3 kg (172 lb 9.6 oz)    ROS: Review of Systems  Constitutional: Negative for chills and fever.  Eyes: Negative for blurred vision.  Respiratory: Negative for cough and shortness of breath.   Cardiovascular: Negative for chest pain.  Gastrointestinal: Negative for abdominal pain, constipation, diarrhea, nausea and vomiting.  Genitourinary: Positive for urgency. Negative for dysuria.  Musculoskeletal: Positive for back pain and joint pain.  Neurological: Negative for dizziness and headaches.   Exam: Physical Exam  HENT:  Nose: No mucosal edema.  Mouth/Throat: No oropharyngeal exudate or posterior oropharyngeal edema.  Eyes: Pupils are equal, round, and reactive to light. Conjunctivae, EOM and lids are normal.  Neck: No JVD present. Carotid bruit is not present. No edema present. No thyroid mass and no thyromegaly present.  Cardiovascular: S1 normal and S2 normal. Exam reveals no gallop.  Murmur heard.  Systolic murmur is present with a grade of 2/6. Pulses:      Dorsalis pedis pulses are 2+ on the right side, and 2+ on the left side.  Respiratory: No respiratory distress. He has no wheezes. He has no rhonchi. He has no rales.  GI: Soft. Bowel sounds are normal. There is no tenderness.  Musculoskeletal:       Right ankle: He exhibits swelling.       Left ankle: He exhibits swelling.   Lymphadenopathy:    He has no cervical adenopathy.  Neurological: He is alert. No cranial nerve deficit.  Skin: Skin is warm. No rash noted. Nails show no clubbing.  Psychiatric: He has a normal mood and affect.      Data Reviewed: Basic Metabolic Panel: Recent Labs  Lab 05/11/18 2059 05/12/18 0548 05/13/18 0430 05/14/18 0718  NA 133* 134* 135 133*  K 3.4* 3.8 3.9 4.0  CL 100 100 104 102  CO2 25 27 26 25   GLUCOSE 155* 102* 89 94  BUN 17 16 12 14   CREATININE 0.95 0.74 0.75 0.68  CALCIUM 8.5* 8.4* 8.4* 8.3*   Liver Function Tests: Recent Labs  Lab 05/11/18 2059  AST 34  ALT 17  ALKPHOS 30*  BILITOT 1.2  PROT 5.7*  ALBUMIN 3.3*   CBC: Recent Labs  Lab 05/11/18 2059 05/12/18 0548 05/13/18 0430  WBC 11.9* 7.6  --   NEUTROABS 10.5*  --   --   HGB 13.7 13.6 13.4  HCT 39.7* 39.2*  --   MCV 89.9 89.9  --   PLT 171 162  --    Cardiac Enzymes: Recent Labs  Lab 05/11/18 2059 05/12/18 0548 05/12/18 1421 05/13/18 0430 05/14/18 0718  CKTOTAL 441*  --   --  2,128* 1,600*  TROPONINI 0.08* 0.16* 0.12*  --   --     CBG: Recent Labs  Lab 05/12/18 0739 05/12/18 1144 05/13/18 0841 05/14/18  0733  GLUCAP 87 106* 109* 96      Scheduled Meds: . aspirin EC  81 mg Oral Daily  . docusate sodium  100 mg Oral BID  . finasteride  5 mg Oral Daily  . heparin  5,000 Units Subcutaneous Q8H  . meloxicam  15 mg Oral Daily  . tamsulosin  0.4 mg Oral Daily   Continuous Infusions: . sodium chloride 75 mL/hr at 05/13/18 0913    Assessment/Plan:  1. Acute rhabdomyolysis.  CPK  trending better it is now down to 1600 today.  Elevated troponin likely secondary to rhabdomyolysis.  Increase IV fluids to 75 cc an hour.  Recheck CPK tomorrow morning. 2. Elevated troponin secondary to rhabdomyolysis and demand ischemia. 3. BPH with urinating every hour.  Added finasteride to Flomax. 4. Chronic back pain 5. Right shoulder pain.  Started Mobic.  Code Status:     Code  Status Orders  (From admission, onward)        Start     Ordered   05/12/18 1045  Do not attempt resuscitation (DNR)  Continuous    Question Answer Comment  In the event of cardiac or respiratory ARREST Do not call a "code blue"   In the event of cardiac or respiratory ARREST Do not perform Intubation, CPR, defibrillation or ACLS   In the event of cardiac or respiratory ARREST Use medication by any route, position, wound care, and other measures to relive pain and suffering. May use oxygen, suction and manual treatment of airway obstruction as needed for comfort.   Comments nurse may pronounce      05/12/18 1045    Code Status History    Date Active Date Inactive Code Status Order ID Comments User Context   05/12/2018 0054 05/12/2018 1045 Full Code [id number]  Amelia Jo, MD ED    Advance Directive Documentation     Most Recent Value  Type of Advance Directive  Healthcare Power of Attorney  Pre-existing out of facility DNR order (yellow form or pink MOST form)  -  "MOST" Form in Place?  -     Family Communication: Spoke with daughter at the bedside Disposition Plan: Potentially home tomorrow if CPK trends better  Consultants:  Cardiology  Orthopedic surgery  Time spent: 27 minutes  [place]

## 2018-05-14 NOTE — Plan of Care (Signed)
No complaints of chest pain. Patient has frequent urination due to BPH.

## 2018-05-14 NOTE — Evaluation (Signed)
Occupational Therapy Evaluation Patient Details Name: Henry [name] MRN: [id number] DOB: 02-09-1925 Today's Date: 05/14/2018    History of Present Illness Pt presents to ED on 05/11/18 after fall. Pt diagnosed with R RTC tear and glenoid chip fracture. Ortho consult states that pts R shld is non operative. Pt also displayed elevated troponin which cardio consult states is due to demand eschemia. Pt has a past medical history that includes prostate disorder.   Clinical Impression   Pt seen for OT evaluation this date. Prior to hospital admission, pt was independent with ADL, using a SPC generally, and remaining active, working daily in the [employer]. Pt lives alone and requires family assist for transportation, but otherwise independent. Currently pt demonstrates impairments in activity tolerance, balance, strength, R shoulder AROM and strength with pain, and chronic lower back pain making it difficult for the pt to independently perform mobility and ADL tasks at previous baseline. Pt requires CGA to min assist for bed mobility, toilet transfers, LB ADL. Pt/dtr instructed in supine R shoulder flexion exercise x10 AAROM to support strengthening of the anterior deltoid, positioning during ADL/IADL related to energy conservation and minimizing lower back pain/strain, and falls prevention strategies including hydration. Pt would benefit from skilled OT services to address noted impairments and functional limitations (see below for any additional details) in order to maximize safety and independence while minimizing falls risk and caregiver burden. Would benefit from training in AE/DME to improve performance with ADL with focus on pain mgt/joint protection as well as energy conservation. Upon hospital discharge, recommend pt discharge to home with [facility] and frequent initial supervision/assist from family to support safe transition home.      Follow Up Recommendations  Home health OT;Supervision -  Intermittent    Equipment Recommendations  Other (comment)(TBD, may benefit from a reacher, toilet riser)    Recommendations for Other Services       Precautions / Restrictions Precautions Precautions: Fall Restrictions Weight Bearing Restrictions: No      Mobility Bed Mobility Overal bed mobility: Needs Assistance Bed Mobility: Supine to Sit;Sit to Supine     Supine to sit: Min assist;HOB elevated;Min guard Sit to supine: Min assist;Min guard      Transfers Overall transfer level: Needs assistance Equipment used: None Transfers: Sit to/from Stand Sit to Stand: Min guard;Supervision              Balance Overall balance assessment: Needs assistance Sitting-balance support: Feet supported Sitting balance-Leahy Scale: Good       Standing balance-Leahy Scale: Fair                             ADL either performed or assessed with clinical judgement   ADL Overall ADL's : Needs assistance/impaired Eating/Feeding: Sitting;Independent   Grooming: Standing;Min guard;Supervision/safety;Wash/dry hands   Upper Body Bathing: Sitting;Supervision/ safety   Lower Body Bathing: Sit to/from stand;Min guard;Supervison/ safety Lower Body Bathing Details (indicate cue type and reason): encouraged pt/family to take seated shower initially at home     Lower Body Dressing: Sit to/from stand;Supervision/safety;Min guard   Toilet Transfer: Ambulation;Regular Toilet;Min guard;Supervision/safety   Toileting- Clothing Manipulation and Hygiene: Supervision/safety;Sitting/lateral lean       Functional mobility during ADLs: Min guard;Supervision/safety       Vision Baseline Vision/History: Wears glasses;Cataracts Wears Glasses: At all times Patient Visual Report: No change from baseline       Perception     Praxis  Pertinent Vitals/Pain Pain Assessment: 0-10 Pain Score: 5  Pain Location: low back and R shoulder Pain Descriptors / Indicators:  Aching;Discomfort;Guarding Pain Intervention(s): Limited activity within patient's tolerance;Monitored during session;Repositioned     Hand Dominance Right   Extremity/Trunk Assessment Upper Extremity Assessment Upper Extremity Assessment: RUE deficits/detail;LUE deficits/detail RUE Deficits / Details: elbow and wrist grossly 4/5. Shoulder unable to fully assess due to pain. Limited ROM secondary to pain to approx 90 deg (w/ pain), AAROM WFL.  RUE: Unable to fully assess due to pain LUE Deficits / Details: Grossly at least 4/5   Lower Extremity Assessment Lower Extremity Assessment: Overall WFL for tasks assessed;Defer to PT evaluation   Cervical / Trunk Assessment Cervical / Trunk Assessment: Kyphotic   Communication Communication Communication: Prefers language other than English(Greek; daughter assisted in communication)   Cognition Arousal/Alertness: Awake/alert Behavior During Therapy: WFL for tasks assessed/performed Overall Cognitive Status: Within Functional Limits for tasks assessed                                 General Comments: alert and oriented, follows commands, slight difficulty with communication but daughter present and able to assist as needed.    General Comments       Exercises Other Exercises Other Exercises: Pt/dtr educated in falls prevention strategies including importance of hydration to minimize falls risk Other Exercises: Pt/dtr educated in positioning and energy conservation to maximize safety, particularly in the shower; would benefit from additional training in ECS Other Exercises: Pt/dtr instructed in supine shoulder flexion exercise x10 (AAROM) to strengthen the anterior deltoid muscle and support RTC injury recovery.    Shoulder Instructions      Home Living Family/patient expects to be discharged to:: Private residence Living Arrangements: Alone Available Help at Discharge: Family;Available PRN/intermittently Type of Home:  House Home Access: Ramped entrance     Home Layout: Two level;Able to live on main level with bedroom/bathroom(pt at baseline stays on first level)     Bathroom Shower/Tub: Occupational psychologist: Standard     Home Equipment: Cane - single point;Shower seat   Additional Comments: shower chair was pt's spouse's      Prior Functioning/Environment Level of Independence: Independent with assistive device(s)        Comments: Pt is very independent at baseline using SPC. Pt takes care of self in home and works at [employer] his family owns. Pt denies any other falls in past 12 months. Family drives, otherwise he's very independent.        OT Problem List: Decreased strength;Decreased knowledge of use of DME or AE;Decreased range of motion;Impaired UE functional use;Decreased activity tolerance;Pain;Impaired balance (sitting and/or standing)      OT Treatment/Interventions: Self-care/ADL training;Balance training;Therapeutic exercise;Therapeutic activities;DME and/or AE instruction;Energy conservation;Patient/family education    OT Goals(Current goals can be found in the care plan section) Acute Rehab OT Goals Patient Stated Goal: to get back to work OT Goal Formulation: With patient/family Time For Goal Achievement: 05/28/18 Potential to Achieve Goals: Good ADL Goals Pt Will Perform Lower Body Dressing: sit to/from stand;with modified independence(with or without AE with <5/10 lower back pain) Additional ADL Goal #1: Pt will verbalize plan to implement at least 1 learned falls prevention strategy in the home to maximize safety. Additional ADL Goal #2: Pt will verbalize plan to implement at least 1 learned energy conservation strategy in the home to maximize safety and independence.  OT Frequency: Min  1X/week   Barriers to D/C:            Co-evaluation              AM-PAC PT "6 Clicks" Daily Activity     Outcome Measure Help from another person eating  meals?: None Help from another person taking care of personal grooming?: A Little Help from another person toileting, which includes using toliet, bedpan, or urinal?: A Little Help from another person bathing (including washing, rinsing, drying)?: A Little Help from another person to put on and taking off regular upper body clothing?: None Help from another person to put on and taking off regular lower body clothing?: A Little 6 Click Score: 20   End of Session    Activity Tolerance: Patient tolerated treatment well Patient left: in bed;with call bell/phone within reach;with bed alarm set;with family/visitor present  OT Visit Diagnosis: Other abnormalities of gait and mobility (R26.89);History of falling (Z91.81);Pain Pain - Right/Left: Right Pain - part of body: Shoulder                Time: 9702-6378 OT Time Calculation (min): 46 min Charges:  OT General Charges $OT Visit: 1 Visit OT Evaluation $OT Eval Low Complexity: 1 Low OT Treatments $Self Care/Home Management : 23-37 mins $Therapeutic Exercise: 8-22 mins  Jeni Salles, MPH, MS, OTR/L ascom [phone] 05/14/18, 9:56 AM'

## 2018-05-15 LAB — CK: CK TOTAL: 1006 U/L — AB (ref 49–397)

## 2018-05-15 LAB — GLUCOSE, CAPILLARY: Glucose-Capillary: 81 mg/dL (ref 70–99)

## 2018-05-15 MED ORDER — MELOXICAM 15 MG PO TABS: 15.0000 mg | ORAL_TABLET | Freq: Every day | ORAL | 0 refills | Status: AC

## 2018-05-15 MED ORDER — FINASTERIDE 5 MG PO TABS: 5.0000 mg | ORAL_TABLET | Freq: Every day | ORAL | 0 refills | Status: DC

## 2018-05-15 NOTE — Progress Notes (Signed)
Patient ID: DAERON CARRENO, male   DOB: May 08, 1925, 84 y.o.   MRN: [id number]  Patient ID: Henry [name], male   DOB: [birth year], 84 y.o.   MRN: [id number]  [facility] PROGRESS NOTE  Henry [name] [id number]:[id number] DOB: [birth year] DOA: 05/11/2018 PCP: [name], MD  HPI/Subjective: Patient feeling okay.  Still with frequent urination.  Has low back pain and right shoulder pain.  Objective: Vitals:   05/15/18 0816 05/15/18 1434  BP: (!) 178/78 (!) 153/81  Pulse: (!) 56 68  Resp:    Temp: 98.3 F (36.8 C)   SpO2: 97% 95%    Filed Weights   05/12/18 1955 05/14/18 0402 05/15/18 0449  Weight: 75.4 kg (166 lb 3.2 oz) 78.3 kg (172 lb 9.6 oz) 77.4 kg (170 lb 9.6 oz)    ROS: Review of Systems  Constitutional: Negative for chills and fever.  Eyes: Negative for blurred vision.  Respiratory: Negative for cough and shortness of breath.   Cardiovascular: Negative for chest pain.  Gastrointestinal: Negative for abdominal pain, constipation, diarrhea, nausea and vomiting.  Genitourinary: Positive for urgency. Negative for dysuria.  Musculoskeletal: Positive for back pain and joint pain.  Neurological: Negative for dizziness and headaches.   Exam: Physical Exam  HENT:  Nose: No mucosal edema.  Mouth/Throat: No oropharyngeal exudate or posterior oropharyngeal edema.  Eyes: Pupils are equal, round, and reactive to light. Conjunctivae, EOM and lids are normal.  Neck: No JVD present. Carotid bruit is not present. No edema present. No thyroid mass and no thyromegaly present.  Cardiovascular: S1 normal and S2 normal. Exam reveals no gallop.  Murmur heard.  Systolic murmur is present with a grade of 2/6. Pulses:      Dorsalis pedis pulses are 2+ on the right side, and 2+ on the left side.  Respiratory: No respiratory distress. He has no wheezes. He has no rhonchi. He has no rales.  GI: Soft. Bowel sounds are normal. There is no tenderness.  Musculoskeletal:       Right ankle: He  exhibits swelling.       Left ankle: He exhibits swelling.  Lymphadenopathy:    He has no cervical adenopathy.  Neurological: He is alert. No cranial nerve deficit.  Skin: Skin is warm. No rash noted. Nails show no clubbing.  Psychiatric: He has a normal mood and affect.      Data Reviewed: Basic Metabolic Panel: Recent Labs  Lab 05/11/18 2059 05/12/18 0548 05/13/18 0430 05/14/18 0718  NA 133* 134* 135 133*  K 3.4* 3.8 3.9 4.0  CL 100 100 104 102  CO2 25 27 26 25   GLUCOSE 155* 102* 89 94  BUN 17 16 12 14   CREATININE 0.95 0.74 0.75 0.68  CALCIUM 8.5* 8.4* 8.4* 8.3*   Liver Function Tests: Recent Labs  Lab 05/11/18 2059  AST 34  ALT 17  ALKPHOS 30*  BILITOT 1.2  PROT 5.7*  ALBUMIN 3.3*   CBC: Recent Labs  Lab 05/11/18 2059 05/12/18 0548 05/13/18 0430  WBC 11.9* 7.6  --   NEUTROABS 10.5*  --   --   HGB 13.7 13.6 13.4  HCT 39.7* 39.2*  --   MCV 89.9 89.9  --   PLT 171 162  --    Cardiac Enzymes: Recent Labs  Lab 05/11/18 2059 05/12/18 0548 05/12/18 1421 05/13/18 0430 05/14/18 0718 05/15/18 0423  CKTOTAL 441*  --   --  2,128* 1,600* 1,006*  TROPONINI 0.08* 0.16* 0.12*  --   --   --  CBG: Recent Labs  Lab 05/12/18 0739 05/12/18 1144 05/13/18 0841 05/14/18 0733 05/15/18 0748  GLUCAP 87 106* 109* 96 81      Scheduled Meds: . aspirin EC  81 mg Oral Daily  . docusate sodium  100 mg Oral BID  . finasteride  5 mg Oral Daily  . heparin  5,000 Units Subcutaneous Q8H  . meloxicam  15 mg Oral Daily  . tamsulosin  0.4 mg Oral Daily   Continuous Infusions:   Assessment/Plan:  1. Sinus pause of 2.2 seconds.  Case discussed with cardiology and he wants to look over the telemetry monitoring prior to making a decision.  Still awaiting cardiology evaluation today.  Patient was asymptomatic.  Patient did have an event where he fell down.  Hard to tell what actually happened.  He stated he felt dizzy and then fell.  2. Acute rhabdomyolysis.  CPK   trending better it is now down to 1000 today.  Elevated troponin likely secondary to rhabdomyolysis.  Stop IV fluids since blood pressure going up.  Blood pressure trended better since stopping IV fluids. 3. Elevated troponin secondary to rhabdomyolysis and demand ischemia. 4. BPH with urinating every hour.  Added finasteride to Flomax. 5. Chronic back pain 6. Right shoulder pain.  Started Mobic.  Code Status:     Code Status Orders  (From admission, onward)        Start     Ordered   05/12/18 1045  Do not attempt resuscitation (DNR)  Continuous    Question Answer Comment  In the event of cardiac or respiratory ARREST Do not call a "code blue"   In the event of cardiac or respiratory ARREST Do not perform Intubation, CPR, defibrillation or ACLS   In the event of cardiac or respiratory ARREST Use medication by any route, position, wound care, and other measures to relive pain and suffering. May use oxygen, suction and manual treatment of airway obstruction as needed for comfort.   Comments nurse may pronounce      05/12/18 1045    Code Status History    Date Active Date Inactive Code Status Order ID Comments User Context   05/12/2018 0054 05/12/2018 1045 Full Code [id number]  Amelia Jo, MD ED    Advance Directive Documentation     Most Recent Value  Type of Advance Directive  Healthcare Power of Attorney  Pre-existing out of facility DNR order (yellow form or pink MOST form)  -  "MOST" Form in Place?  -     Family Communication: Spoke with daughter at the bedside Disposition Plan:  awaiting cardiology reevaluation prior to making a decision.  Consultants:  Cardiology  Orthopedic surgery  Time spent: 22 minutes  [place]

## 2018-05-15 NOTE — Care Management Important Message (Signed)
Copy of signed IM left with patient and daughter-in-law in room.

## 2018-05-15 NOTE — Progress Notes (Signed)
Rounded with Dr. Leslye Peer earlier this morning. He was aware of 2.2 second pause per CCMD. MD has notified Dr. Nehemiah Massed. Continuous fluids stopped per Dr. Leslye Peer. Family at bedside.  No complaints. WIll continue to monitor.

## 2018-05-15 NOTE — Progress Notes (Signed)
Henry [name] to be D/C'd Home per MD order. Patient given discharge teaching and paperwork regarding medications, diet, follow-up appointments and activity. Patient understanding verbalized. No questions or complaints at this time. Skin condition as charted. IV and telemetry removed prior to leaving.  Prescriptions given to patient  An After Visit Summary was printed and given to the patient.   Per daughter-in-law, patient is supposed to have a walker and possible home health or outpatient PT/OT. Notified Dr. Leslye Peer that all care managers are gone for the day. Instructed to place order for DME walker and MD will discuss with care management tomorrow to have it delivered and care services set up as needed. Family agreeable.  Terrilyn Saver

## 2018-05-15 NOTE — Progress Notes (Signed)
[facility] Encounter Note  Patient: Henry [name] / Admit Date: 05/11/2018 / Date of Encounter: 05/15/2018, 5:11 PM   Subjective: Patient able to ambulate fairly well last night with concerns of urinary frequency.  No evidence of syncope unsteadiness or weakness.  Patient has not had any telemetry showing any significant advanced heart block.  He has had first-degree AV block and 2 episodes while he was in the hospital of second-degree type I AV block without any symptoms.  Therefore there are no significant  concerns for need for further work-up and/or pacemaker placement.  No evidence of chest pain shortness of breath or congestive heart failure type symptoms. CPK elevation is improving with hydration   Review of Systems: Positive for: None Negative for: Vision change, hearing change, syncope, dizziness, nausea, vomiting,diarrhea, bloody stool, stomach pain, cough, congestion, diaphoresis, urinary frequency, urinary pain,skin lesions, skin rashes Others previously listed  Objective: Telemetry: Normal sinus rhythm Physical Exam: Blood pressure 125/68, pulse 67, temperature (!) 97.4 F (36.3 C), temperature source Oral, resp. rate 14, height 5\' 8"  (1.727 m), weight 170 lb 9.6 oz (77.4 kg), SpO2 97 %. Body mass index is 25.94 kg/m. General: Well developed, well nourished, in no acute distress. Head: Normocephalic, atraumatic, sclera non-icteric, no xanthomas, nares are without discharge. Neck: No apparent masses Lungs: Normal respirations with no wheezes, no rhonchi, no rales , no crackles   Heart: Regular rate and rhythm, normal S1 S2, no murmur, no rub, no gallop, PMI is normal size and placement, carotid upstroke normal without bruit, jugular venous pressure normal Abdomen: Soft, non-tender, non-distended with normoactive bowel sounds. No hepatosplenomegaly. Abdominal aorta is normal size without bruit Extremities: Trace edema, no clubbing, no cyanosis, no ulcers,   Peripheral: 2+ radial, 2+ femoral, 2+ dorsal pedal pulses Neuro: Alert and oriented. Moves all extremities spontaneously. Psych:  Responds to questions appropriately with a normal affect.   Intake/Output Summary (Last 24 hours) at 05/15/2018 1711 Last data filed at 05/15/2018 0700 Gross per 24 hour  Intake 600 ml  Output 1000 ml  Net -400 ml    Inpatient Medications:  . aspirin EC  81 mg Oral Daily  . docusate sodium  100 mg Oral BID  . finasteride  5 mg Oral Daily  . heparin  5,000 Units Subcutaneous Q8H  . meloxicam  15 mg Oral Daily  . tamsulosin  0.4 mg Oral Daily   Infusions:    Labs: Recent Labs    05/13/18 0430 05/14/18 0718  NA 135 133*  K 3.9 4.0  CL 104 102  CO2 26 25  GLUCOSE 89 94  BUN 12 14  CREATININE 0.75 0.68  CALCIUM 8.4* 8.3*   No results for input(s): AST, ALT, ALKPHOS, BILITOT, PROT, ALBUMIN in the last 72 hours. Recent Labs    05/13/18 0430  HGB 13.4   Recent Labs    05/13/18 0430 05/14/18 0718 05/15/18 0423  CKTOTAL 2,128* 1,600* 1,006*   Invalid input(s): POCBNP No results for input(s): HGBA1C in the last 72 hours.   Weights: Filed Weights   05/12/18 1955 05/14/18 0402 05/15/18 0449  Weight: 166 lb 3.2 oz (75.4 kg) 172 lb 9.6 oz (78.3 kg) 170 lb 9.6 oz (77.4 kg)     Radiology/Studies:  Dg Lumbar Spine 2-3 Views  Result Date: 05/11/2018 CLINICAL DATA:  Fall with low back pain EXAM: LUMBAR SPINE - 2-3 VIEW COMPARISON:  04/10/2008 FINDINGS: Five non rib-bearing lumbar type vertebra. SI joints are patent. Vertebral body heights are  normal. Flowing osteophytes over the lumbar spine. Mild to moderate degenerative changes at L1-L2 and L2-L3 with mild degenerative change at L3 through S1. Aortic calcification IMPRESSION: Degenerative changes.  No acute osseous abnormality. Electronically Signed   By: Donavan Foil M.D.   On: 05/11/2018 21:31   Dg Shoulder Right  Result Date: 05/11/2018 CLINICAL DATA:  Fall with pain in the right shoulder  EXAM: RIGHT SHOULDER - 2+ VIEW COMPARISON:  None. FINDINGS: AC joint is intact. The right lung apex is clear. No dislocation. High-riding humeral head consistent with rotator cuff disease. Age indeterminate fracture at the inferior glenoid rim. Old appearing right fourth rib fracture. IMPRESSION: 1. No dislocation. 2. Age indeterminate fracture off the inferior glenoid rim. 3. High-riding humeral head suggesting rotator cuff disease Electronically Signed   By: Donavan Foil M.D.   On: 05/11/2018 21:30   Ct Head Wo Contrast  Result Date: 05/11/2018 CLINICAL DATA:  Unwitnessed mechanical fall EXAM: CT HEAD WITHOUT CONTRAST TECHNIQUE: Contiguous axial images were obtained from the base of the skull through the vertex without intravenous contrast. COMPARISON:  None. FINDINGS: Brain: No acute territorial infarction, hemorrhage or intracranial mass. Marked atrophy. Moderate severe small vessel ischemic changes of the white matter. Enlarged ventricles, felt secondary to atrophy. Vascular: No hyperdense vessels.  Carotid vascular calcification. Skull: Normal. Negative for fracture or focal lesion. Sinuses/Orbits: No acute finding. Other: None IMPRESSION: 1. No CT evidence for acute intracranial abnormality. 2. Significant atrophy and small vessel ischemic changes of the white matter Electronically Signed   By: Donavan Foil M.D.   On: 05/11/2018 21:38     Assessment and Recommendation  84 y.o. male with acute episode of fall with significant rhabdomyolysis and elevation of CPK now slightly improved with no further evidence of weakness heart failure or myocardial infarction.  Troponin elevation most consistent with rhabdomyolysis rather than acute coronary syndrome.  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  Patient okay for discharge home from the cardiac standpoint if ambulating well without any significant symptoms with  possible follow-up in 1 week for further treatment 2.  No additional diagnostic testing from the cardiovascular standpoint due to no evidence of myocardial infarction at this time or acute coronary syndrome 3.  Continue supportive care for fall and rehabilitation with reassessment of home care 4.  Call if further questions  Signed, Serafina Royals M.D. FACC

## 2018-05-15 NOTE — Progress Notes (Signed)
Physical Therapy Treatment Patient Details Name: Henry [name] MRN: [id number] DOB: 1925/02/28 Today's Date: 05/15/2018    History of Present Illness Pt presents to ED on 05/11/18 after fall. Pt diagnosed with R RTC tear and glenoid chip fracture. Ortho consult states that pts R shld is non operative. Pt also displayed elevated troponin which cardio consult states is due to demand eschemia. Pt has a past medical history that includes prostate disorder.    PT Comments    Pt continues to make excellent progress with therapy. He is modified independent for bed mobility and CGA/supervision for transfers from bed and commode. Pt is able to complete two laps around RN station with therapist. Pt requires intermittent cues to stay within confines of walker and improve his upright posture. Vitals intermittently monitored and SaO2 remains >95% on room air. HR is around 108 bpm with self-selected ambulation speed around RN station. Pt is safe and steady with ambulation. Recommend use of rolling walker at discharge for added safety as well as [facility] PT to work on strength and balance as well as R shoulder weakness. Pt will benefit from PT services to address deficits in strength, balance, and mobility in order to return to full function at home.     Follow Up Recommendations  Home health PT     Equipment Recommendations  Rolling walker with 5" wheels    Recommendations for Other Services OT consult     Precautions / Restrictions Precautions Precautions: Fall Restrictions Weight Bearing Restrictions: No    Mobility  Bed Mobility Overal bed mobility: Modified Independent       Supine to sit: Modified independent (Device/Increase time) Sit to supine: Modified independent (Device/Increase time)   General bed mobility comments: HOB milldy elevated, use of bed rails and increase in time. No external assist required from therapist or family  Transfers Overall transfer level: Needs  assistance Equipment used: Rolling walker (2 wheeled) Transfers: Sit to/from Stand Sit to Stand: Min guard;Supervision         General transfer comment: Pt requires cues for safe hand placement. During first transfer pt falls backwards due to poor anterior weight shifting. Provided correction and pt transfers with good stability in standing. Pt also performs transfer from commode with use of hand rail and good stability  Ambulation/Gait Ambulation/Gait assistance: Min guard Gait Distance (Feet): 350 Feet Assistive device: Rolling walker (2 wheeled) Gait Pattern/deviations: Trunk flexed Gait velocity: WFL for limited community ambulation   General Gait Details: Pt is able to complete two laps around RN station with therapist. Pt requires intermittent cues to stay within confines of walker and improve his upright posture. Vitals intermittently monitored and SaO2 remains >95% on room air. HR is around 108 bpm with self-selected ambulation speed around RN station   Stairs             Wheelchair Mobility    Modified Rankin (Stroke Patients Only)       Balance Overall balance assessment: Needs assistance Sitting-balance support: Feet supported Sitting balance-Leahy Scale: Good       Standing balance-Leahy Scale: Fair Standing balance comment: Able to balance without UE support with a wide base                            Cognition Arousal/Alertness: Awake/alert Behavior During Therapy: WFL for tasks assessed/performed Overall Cognitive Status: Within Functional Limits for tasks assessed  General Comments: alert and oriented, follows commands, slight difficulty with communication but daughter present and able to assist as needed in addition to language line interpreter      Exercises Other Exercises Other Exercises: Extensive education with patient and family regarding safe use of assistive devices, R shoulder  exercises, and postural correction for standing    General Comments        Pertinent Vitals/Pain Pain Assessment: Faces Faces Pain Scale: Hurts a little bit Pain Location: low back and R shoulder WITH MOVEMENT Pain Descriptors / Indicators: Aching;Discomfort;Guarding Pain Intervention(s): Monitored during session    Home Living                      Prior Function            PT Goals (current goals can now be found in the care plan section) Acute Rehab PT Goals Patient Stated Goal: to get back to work PT Goal Formulation: With patient Time For Goal Achievement: 05/27/18 Potential to Achieve Goals: Fair    Frequency    Min 2X/week      PT Plan      Co-evaluation              AM-PAC PT "6 Clicks" Daily Activity  Outcome Measure  Difficulty turning over in bed (including adjusting bedclothes, sheets and blankets)?: None Difficulty moving from lying on back to sitting on the side of the bed? : None Difficulty sitting down on and standing up from a chair with arms (e.g., wheelchair, bedside commode, etc,.)?: None Help needed moving to and from a bed to chair (including a wheelchair)?: A Little Help needed walking in hospital room?: A Little Help needed climbing 3-5 steps with a railing? : A Little 6 Click Score: 21    End of Session Equipment Utilized During Treatment: Gait belt Activity Tolerance: Patient tolerated treatment well Patient left: with call bell/phone within reach;in bed;with bed alarm set Nurse Communication: Mobility status PT Visit Diagnosis: Unsteadiness on feet (R26.81);Other abnormalities of gait and mobility (R26.89);Muscle weakness (generalized) (M62.81);History of falling (Z91.81);Difficulty in walking, not elsewhere classified (R26.2);Pain Pain - Right/Left: Right Pain - part of body: Shoulder     Time: 1610-9604 PT Time Calculation (min) (ACUTE ONLY): 31 min  Charges:  $Gait Training: 8-22 mins $Therapeutic Activity:  8-22 mins                    G Codes:       Jason D Huprich PT, DPT, GCS   Huprich,Jason 05/15/2018, 1:07 PM

## 2018-05-15 NOTE — Progress Notes (Signed)
Per Dr. Erin Fulling, patient is stable for discharge. Dr. Leslye Peer notified. Instructed to place discharge home order with cardiology follow-up in one week (per Dr. Nehemiah Massed, patient should return to see Dr. Lavera Guise). Family agreeable.

## 2018-05-16 DIAGNOSIS — R2681 Unsteadiness on feet: Secondary | ICD-10-CM | POA: Diagnosis not present

## 2018-05-16 NOTE — Discharge Summary (Signed)
[facility] [place] NAME: Henry [name]    MR#:  [id number]  DATE OF BIRTH:  1924/12/30  DATE OF ADMISSION:  05/11/2018 ADMITTING PHYSICIAN: Amelia Jo, MD  DATE OF DISCHARGE: 05/15/2018  6:06 PM  PRIMARY CARE PHYSICIAN: [name], MD    ADMISSION DIAGNOSIS:  Elevated troponin I level [R74.8] Fall, initial encounter [W19.XXXA] Closed fracture of rim of glenoid fossa of right scapula, sequela [S42.141S]  DISCHARGE DIAGNOSIS:  Acute rhabdomyolysis  SECONDARY DIAGNOSIS:   Past Medical History:  Diagnosis Date  . Prostate disorder     HOSPITAL COURSE:   1.  Acute rhabdomyolysis.  Initial CPK when he came in and was around 400.  Then went up to 2400 the next day and down to 1600 and then 1000.  The patient was doing well and asymptomatic and was eating and drinking okay so the patient was discharged home. 2.  Sinus pause of 2.2 seconds.  Seen by cardiology Dr. Nehemiah Massed here in the hospital and he was cleared to go home.  Patient not on any rate controlling medications during the episode. 3.  Elevated troponin secondary to rhabdomyolysis and demand ischemia.  This was not a myocardial infarction. 4.  BPH with urinating every hour.  I added finasteride to his Flomax.  Follow-up with Dr. Bernardo Heater as outpatient. 5.  Chronic back pain 6.  Right shoulder pain.  Which could be a rotator cuff tear.  Patient was seen in consultation by Dr. Leim Fabry and he recommended physical therapy.  7.  Home health set up with care manager the morning after discharge since the patient was cleared to go home by cardiology late.  I signed the form on a handwritten sheet.  [facility] will deliver [facility] walker and set up home health.  DISCHARGE CONDITIONS:   Satisfactory  CONSULTS OBTAINED:  Treatment Team:  Leim Fabry, MD  DRUG ALLERGIES:  No Known Allergies  DISCHARGE MEDICATIONS:   Allergies as of 05/15/2018   No Known Allergies      Medication List    STOP taking these medications   naproxen sodium 220 MG tablet Commonly known as:  ALEVE     TAKE these medications   aspirin 81 MG tablet Take 1 tablet by mouth daily.   finasteride 5 MG tablet Commonly known as:  PROSCAR Take 1 tablet (5 mg total) by mouth daily.   meloxicam 15 MG tablet Commonly known as:  MOBIC Take 1 tablet (15 mg total) by mouth daily.   tamsulosin 0.4 MG Caps capsule Commonly known as:  FLOMAX Take 0.4 mg by mouth daily.        DISCHARGE INSTRUCTIONS:   6 days Follow-up PMD  Follow-up cardiology 1 week  If you experience worsening of your admission symptoms, develop shortness of breath, life threatening emergency, suicidal or homicidal thoughts you must seek medical attention immediately by calling 911 or calling your MD immediately  if symptoms less severe.  You Must read complete instructions/literature along with all the possible adverse reactions/side effects for all the Medicines you take and that have been prescribed to you. Take any new Medicines after you have completely understood and accept all the possible adverse reactions/side effects.   Please note  You were cared for by a hospitalist during your hospital stay. If you have any questions about your discharge medications or the care you received while you were in the hospital after you are discharged, you can call the unit  and asked to speak with the hospitalist on call if the hospitalist that took care of you is not available. Once you are discharged, your primary care physician will handle any further medical issues. Please note that NO REFILLS for any discharge medications will be authorized once you are discharged, as it is imperative that you return to your primary care physician (or establish a relationship with a primary care physician if you do not have one) for your aftercare needs so that they can reassess your need for medications and monitor your lab  values.    Today   CHIEF COMPLAINT:   Chief Complaint  Patient presents with  . Fall    unwitnessed    HISTORY OF PRESENT ILLNESS:  Henry [name]  is a 84 y.o. male brought in after a fall possible syncope   VITAL SIGNS:  Blood pressure 125/68, pulse 67, temperature (!) 97.4 F (36.3 C), temperature source Oral, resp. rate 14, height 5\' 8"  (1.727 m), weight 77.4 kg (170 lb 9.6 oz), SpO2 97 %.  PHYSICAL EXAMINATION:  GENERAL:  84 y.o.-year-old patient lying in the bed with no acute distress.  EYES: Pupils equal, round, reactive to light and accommodation. No scleral icterus. Extraocular muscles intact.  HEENT: Head atraumatic, normocephalic. Oropharynx and nasopharynx clear.  NECK:  Supple, no jugular venous distention. No thyroid enlargement, no tenderness.  LUNGS: Normal breath sounds bilaterally, no wheezing, rales,rhonchi or crepitation. No use of accessory muscles of respiration.  CARDIOVASCULAR: S1, S2 normal. No murmurs, rubs, or gallops.  ABDOMEN: Soft, non-tender, non-distended. Bowel sounds present. No organomegaly or mass.  EXTREMITIES: No pedal edema, cyanosis, or clubbing.  Limited movement of right arm at the shoulder. NEUROLOGIC: Cranial nerves II through XII are intact. Sensation intact. Gait not checked.  PSYCHIATRIC: The patient is alert and oriented x 3.  SKIN: No obvious rash, lesion, or ulcer.   DATA REVIEW:   CBC Recent Labs  Lab 05/12/18 0548 05/13/18 0430  WBC 7.6  --   HGB 13.6 13.4  HCT 39.2*  --   PLT 162  --     Chemistries  Recent Labs  Lab 05/11/18 2059  05/14/18 0718  NA 133*   < > 133*  K 3.4*   < > 4.0  CL 100   < > 102  CO2 25   < > 25  GLUCOSE 155*   < > 94  BUN 17   < > 14  CREATININE 0.95   < > 0.68  CALCIUM 8.5*   < > 8.3*  AST 34  --   --   ALT 17  --   --   ALKPHOS 30*  --   --   BILITOT 1.2  --   --    < > = values in this interval not displayed.    Cardiac Enzymes Recent Labs  Lab 05/12/18 1421  TROPONINI  0.12*    Management plans discussed with the patient, family and they are in agreement.  CODE STATUS:  Code Status History    Date Active Date Inactive Code Status Order ID Comments User Context   05/12/2018 1045 05/15/2018 2106 DNR [id number]  Loletha Grayer, MD Inpatient   05/12/2018 0054 05/12/2018 1045 Full Code [id number]  Amelia Jo, MD ED    Questions for Most Recent Historical Code Status (Order [id number])    Question Answer Comment   In the event of cardiac or respiratory ARREST Do not call a "code blue"    In  the event of cardiac or respiratory ARREST Do not perform Intubation, CPR, defibrillation or ACLS    In the event of cardiac or respiratory ARREST Use medication by any route, position, wound care, and other measures to relive pain and suffering. May use oxygen, suction and manual treatment of airway obstruction as needed for comfort.    Comments nurse may pronounce         Advance Directive Documentation     Most Recent Value  Type of Advance Directive  Healthcare Power of Attorney  Pre-existing out of facility DNR order (yellow form or pink MOST form)  -  "MOST" Form in Place?  -      TOTAL TIME TAKING CARE OF THIS PATIENT: 35 minutes.    Loletha Grayer M.D on 05/16/2018 at 4:02 PM  Between 7am to 6pm - Pager - [phone]  After 6pm go to [email or web address] - password [email or web address]  [phone]  CC: Primary care physician; [name], MD

## 2018-05-17 DIAGNOSIS — M6282 Rhabdomyolysis: Secondary | ICD-10-CM | POA: Diagnosis not present

## 2018-05-17 DIAGNOSIS — S46011D Strain of muscle(s) and tendon(s) of the rotator cuff of right shoulder, subsequent encounter: Secondary | ICD-10-CM | POA: Diagnosis not present

## 2018-05-17 DIAGNOSIS — R2689 Other abnormalities of gait and mobility: Secondary | ICD-10-CM | POA: Diagnosis not present

## 2018-05-17 DIAGNOSIS — Z7982 Long term (current) use of aspirin: Secondary | ICD-10-CM | POA: Diagnosis not present

## 2018-05-17 DIAGNOSIS — N400 Enlarged prostate without lower urinary tract symptoms: Secondary | ICD-10-CM | POA: Diagnosis not present

## 2018-05-17 DIAGNOSIS — Z87891 Personal history of nicotine dependence: Secondary | ICD-10-CM | POA: Diagnosis not present

## 2018-05-17 DIAGNOSIS — S42142D Displaced fracture of glenoid cavity of scapula, left shoulder, subsequent encounter for fracture with routine healing: Secondary | ICD-10-CM | POA: Diagnosis not present

## 2018-05-17 DIAGNOSIS — Z9181 History of falling: Secondary | ICD-10-CM | POA: Diagnosis not present

## 2018-05-17 DIAGNOSIS — I440 Atrioventricular block, first degree: Secondary | ICD-10-CM | POA: Diagnosis not present

## 2018-05-18 DIAGNOSIS — I509 Heart failure, unspecified: Secondary | ICD-10-CM | POA: Diagnosis not present

## 2018-05-18 DIAGNOSIS — Z758 Other problems related to medical facilities and other health care: Secondary | ICD-10-CM | POA: Diagnosis not present

## 2018-05-18 DIAGNOSIS — E785 Hyperlipidemia, unspecified: Secondary | ICD-10-CM | POA: Diagnosis not present

## 2018-05-18 DIAGNOSIS — K649 Unspecified hemorrhoids: Secondary | ICD-10-CM | POA: Diagnosis not present

## 2018-05-22 DIAGNOSIS — S46011D Strain of muscle(s) and tendon(s) of the rotator cuff of right shoulder, subsequent encounter: Secondary | ICD-10-CM | POA: Diagnosis not present

## 2018-05-22 DIAGNOSIS — Z9181 History of falling: Secondary | ICD-10-CM | POA: Diagnosis not present

## 2018-05-22 DIAGNOSIS — S42142D Displaced fracture of glenoid cavity of scapula, left shoulder, subsequent encounter for fracture with routine healing: Secondary | ICD-10-CM | POA: Diagnosis not present

## 2018-05-22 DIAGNOSIS — I440 Atrioventricular block, first degree: Secondary | ICD-10-CM | POA: Diagnosis not present

## 2018-05-22 DIAGNOSIS — M6282 Rhabdomyolysis: Secondary | ICD-10-CM | POA: Diagnosis not present

## 2018-05-22 DIAGNOSIS — R2689 Other abnormalities of gait and mobility: Secondary | ICD-10-CM | POA: Diagnosis not present

## 2018-05-24 DIAGNOSIS — I440 Atrioventricular block, first degree: Secondary | ICD-10-CM | POA: Diagnosis not present

## 2018-05-24 DIAGNOSIS — S42142D Displaced fracture of glenoid cavity of scapula, left shoulder, subsequent encounter for fracture with routine healing: Secondary | ICD-10-CM | POA: Diagnosis not present

## 2018-05-24 DIAGNOSIS — M6282 Rhabdomyolysis: Secondary | ICD-10-CM | POA: Diagnosis not present

## 2018-05-24 DIAGNOSIS — Z9181 History of falling: Secondary | ICD-10-CM | POA: Diagnosis not present

## 2018-05-24 DIAGNOSIS — R2689 Other abnormalities of gait and mobility: Secondary | ICD-10-CM | POA: Diagnosis not present

## 2018-05-24 DIAGNOSIS — S46011D Strain of muscle(s) and tendon(s) of the rotator cuff of right shoulder, subsequent encounter: Secondary | ICD-10-CM | POA: Diagnosis not present

## 2018-05-28 ENCOUNTER — Other Ambulatory Visit: Payer: Self-pay

## 2018-05-29 DIAGNOSIS — M6282 Rhabdomyolysis: Secondary | ICD-10-CM | POA: Diagnosis not present

## 2018-05-29 DIAGNOSIS — Z9181 History of falling: Secondary | ICD-10-CM | POA: Diagnosis not present

## 2018-05-29 DIAGNOSIS — S42142D Displaced fracture of glenoid cavity of scapula, left shoulder, subsequent encounter for fracture with routine healing: Secondary | ICD-10-CM | POA: Diagnosis not present

## 2018-05-29 DIAGNOSIS — I440 Atrioventricular block, first degree: Secondary | ICD-10-CM | POA: Diagnosis not present

## 2018-05-29 DIAGNOSIS — R2689 Other abnormalities of gait and mobility: Secondary | ICD-10-CM | POA: Diagnosis not present

## 2018-05-29 DIAGNOSIS — S46011D Strain of muscle(s) and tendon(s) of the rotator cuff of right shoulder, subsequent encounter: Secondary | ICD-10-CM | POA: Diagnosis not present

## 2018-05-31 DIAGNOSIS — S42142D Displaced fracture of glenoid cavity of scapula, left shoulder, subsequent encounter for fracture with routine healing: Secondary | ICD-10-CM | POA: Diagnosis not present

## 2018-05-31 DIAGNOSIS — Z9181 History of falling: Secondary | ICD-10-CM | POA: Diagnosis not present

## 2018-05-31 DIAGNOSIS — R2689 Other abnormalities of gait and mobility: Secondary | ICD-10-CM | POA: Diagnosis not present

## 2018-05-31 DIAGNOSIS — I440 Atrioventricular block, first degree: Secondary | ICD-10-CM | POA: Diagnosis not present

## 2018-05-31 DIAGNOSIS — S46011D Strain of muscle(s) and tendon(s) of the rotator cuff of right shoulder, subsequent encounter: Secondary | ICD-10-CM | POA: Diagnosis not present

## 2018-05-31 DIAGNOSIS — M6282 Rhabdomyolysis: Secondary | ICD-10-CM | POA: Diagnosis not present

## 2018-06-06 DIAGNOSIS — S42142D Displaced fracture of glenoid cavity of scapula, left shoulder, subsequent encounter for fracture with routine healing: Secondary | ICD-10-CM | POA: Diagnosis not present

## 2018-06-06 DIAGNOSIS — Z9181 History of falling: Secondary | ICD-10-CM | POA: Diagnosis not present

## 2018-06-06 DIAGNOSIS — M6282 Rhabdomyolysis: Secondary | ICD-10-CM | POA: Diagnosis not present

## 2018-06-06 DIAGNOSIS — S46011D Strain of muscle(s) and tendon(s) of the rotator cuff of right shoulder, subsequent encounter: Secondary | ICD-10-CM | POA: Diagnosis not present

## 2018-06-06 DIAGNOSIS — I440 Atrioventricular block, first degree: Secondary | ICD-10-CM | POA: Diagnosis not present

## 2018-06-06 DIAGNOSIS — R2689 Other abnormalities of gait and mobility: Secondary | ICD-10-CM | POA: Diagnosis not present

## 2018-06-08 DIAGNOSIS — S42142D Displaced fracture of glenoid cavity of scapula, left shoulder, subsequent encounter for fracture with routine healing: Secondary | ICD-10-CM | POA: Diagnosis not present

## 2018-06-08 DIAGNOSIS — R2689 Other abnormalities of gait and mobility: Secondary | ICD-10-CM | POA: Diagnosis not present

## 2018-06-08 DIAGNOSIS — Z9181 History of falling: Secondary | ICD-10-CM | POA: Diagnosis not present

## 2018-06-08 DIAGNOSIS — S46011D Strain of muscle(s) and tendon(s) of the rotator cuff of right shoulder, subsequent encounter: Secondary | ICD-10-CM | POA: Diagnosis not present

## 2018-06-08 DIAGNOSIS — I440 Atrioventricular block, first degree: Secondary | ICD-10-CM | POA: Diagnosis not present

## 2018-06-08 DIAGNOSIS — M6282 Rhabdomyolysis: Secondary | ICD-10-CM | POA: Diagnosis not present

## 2018-06-11 DIAGNOSIS — I440 Atrioventricular block, first degree: Secondary | ICD-10-CM | POA: Diagnosis not present

## 2018-06-11 DIAGNOSIS — S42142D Displaced fracture of glenoid cavity of scapula, left shoulder, subsequent encounter for fracture with routine healing: Secondary | ICD-10-CM | POA: Diagnosis not present

## 2018-06-11 DIAGNOSIS — R2689 Other abnormalities of gait and mobility: Secondary | ICD-10-CM | POA: Diagnosis not present

## 2018-06-11 DIAGNOSIS — M6282 Rhabdomyolysis: Secondary | ICD-10-CM | POA: Diagnosis not present

## 2018-06-11 DIAGNOSIS — S46011D Strain of muscle(s) and tendon(s) of the rotator cuff of right shoulder, subsequent encounter: Secondary | ICD-10-CM | POA: Diagnosis not present

## 2018-06-11 DIAGNOSIS — Z9181 History of falling: Secondary | ICD-10-CM | POA: Diagnosis not present

## 2018-06-15 DIAGNOSIS — I440 Atrioventricular block, first degree: Secondary | ICD-10-CM | POA: Diagnosis not present

## 2018-06-15 DIAGNOSIS — S46011D Strain of muscle(s) and tendon(s) of the rotator cuff of right shoulder, subsequent encounter: Secondary | ICD-10-CM | POA: Diagnosis not present

## 2018-06-15 DIAGNOSIS — R2689 Other abnormalities of gait and mobility: Secondary | ICD-10-CM | POA: Diagnosis not present

## 2018-06-15 DIAGNOSIS — S42142D Displaced fracture of glenoid cavity of scapula, left shoulder, subsequent encounter for fracture with routine healing: Secondary | ICD-10-CM | POA: Diagnosis not present

## 2018-06-15 DIAGNOSIS — M6282 Rhabdomyolysis: Secondary | ICD-10-CM | POA: Diagnosis not present

## 2018-06-15 DIAGNOSIS — Z9181 History of falling: Secondary | ICD-10-CM | POA: Diagnosis not present

## 2018-07-05 ENCOUNTER — Ambulatory Visit (INDEPENDENT_AMBULATORY_CARE_PROVIDER_SITE_OTHER): Payer: Medicare Other | Admitting: Urology

## 2018-07-05 ENCOUNTER — Encounter: Payer: Self-pay | Admitting: Urology

## 2018-07-05 VITALS — BP 144/73 | HR 80 | Ht 66.0 in | Wt 169.2 lb

## 2018-07-05 DIAGNOSIS — R338 Other retention of urine: Secondary | ICD-10-CM | POA: Diagnosis not present

## 2018-07-05 DIAGNOSIS — N401 Enlarged prostate with lower urinary tract symptoms: Secondary | ICD-10-CM

## 2018-07-08 ENCOUNTER — Encounter: Payer: Self-pay | Admitting: Urology

## 2018-07-08 DIAGNOSIS — R338 Other retention of urine: Principal | ICD-10-CM

## 2018-07-08 DIAGNOSIS — N401 Enlarged prostate with lower urinary tract symptoms: Secondary | ICD-10-CM | POA: Insufficient documentation

## 2018-07-08 NOTE — Progress Notes (Signed)
07/05/2018 10:56 AM   Felecia Shelling 1925-01-08 [id number]  Referring provider: [name], MD [address] [place], [place] [address]  Chief Complaint  Patient presents with  . Elevated PSA    HPI: 84 year old male presents for follow-up of a recent hospitalization.  He presents today with his daughter who provided the majority of the history.  He was admitted to [facility] on 05/11/2018 after an unwitnessed fall and was found to be hypotensive with right shoulder pain.  He was admitted with acute rhabdomyolysis and an elevated troponin which was felt secondary to the rhabdomyolysis.  He sustained a closed fracture of the rim of the glenoid fossa in the right shoulder.  He was noted during that hospitalization to be voiding approximately every 20 minutes.  He was discharged on 05/15/2018 and his daughter states this has subsequently improved and he is back to baseline at nocturia x2-3.  He has no voiding complaints.  I saw him for several years for BPH and an elevated PSA.  I last saw him at [facility] approximately 3 years ago.  He had a prostate biopsy performed in early 2000 for PSA of 6.6 with benign pathology.  His baseline PSA was in the 7-10 range and he elected surveillance.  He had lower urinary tract symptoms on tamsulosin.  He did have a PSA with his PCP in May 2018 which was 10.1.  During his hospitalization he was continued on tamsulosin and finasteride was added.  His daughter states he was only given a 30-day prescription and ran out of the medication.  PMH: Past Medical History:  Diagnosis Date  . Arthritis   . History of DVT (deep vein thrombosis)   . Prostate disorder     Surgical History: Past Surgical History:  Procedure Laterality Date  . PROSTATE SURGERY      Home Medications:  Allergies as of 07/05/2018   No Known Allergies     Medication List        Accurate as of 07/05/18 11:59 PM. Always use your most recent med list.          aspirin 81 MG  tablet Take 1 tablet by mouth daily.   meloxicam 15 MG tablet Commonly known as:  MOBIC Take 1 tablet (15 mg total) by mouth daily.   tamsulosin 0.4 MG Caps capsule Commonly known as:  FLOMAX Take 0.4 mg by mouth daily.       Allergies: No Known Allergies  Family History: Family History  Problem Relation Age of Onset  . Esophageal cancer Father   . Heart disease Mother     Social History:  reports that he has quit smoking. He has never used smokeless tobacco. He reports that he drinks alcohol. He reports that he does not use drugs.  ROS: UROLOGY Frequent Urination?: Yes Hard to postpone urination?: Yes Burning/pain with urination?: No Get up at night to urinate?: No Leakage of urine?: No Urine stream starts and stops?: No Trouble starting stream?: No Do you have to strain to urinate?: No Blood in urine?: No Urinary tract infection?: No Sexually transmitted disease?: No Injury to kidneys or bladder?: No Painful intercourse?: No Weak stream?: Yes Erection problems?: No Penile pain?: No  Gastrointestinal Nausea?: No Vomiting?: No Indigestion/heartburn?: No Diarrhea?: No Constipation?: No  Constitutional Fever: No Night sweats?: No Weight loss?: No Fatigue?: No  Skin Skin rash/lesions?: No Itching?: No  Eyes Blurred vision?: No Double vision?: No  Ears/Nose/Throat Sore throat?: No Sinus problems?: No  Hematologic/Lymphatic Swollen glands?: No Easy bruising?: No  Cardiovascular Leg swelling?: No Chest pain?: No  Respiratory Cough?: No Shortness of breath?: No  Endocrine Excessive thirst?: No  Musculoskeletal Back pain?: Yes Joint pain?: No  Neurological Headaches?: No Dizziness?: No  Psychologic Depression?: No Anxiety?: No  Physical Exam: BP (!) 144/73 (BP Location: Left Arm, Patient Position: Sitting, Cuff Size: Normal)   Pulse 80   Ht 5\' 6"  (1.676 m)   Wt 169 lb 3.2 oz (76.7 kg)   BMI 27.31 kg/m   Constitutional:   Alert and oriented, No acute distress. HEENT: [place] AT, moist mucus membranes.  Trachea midline, no masses. Cardiovascular: No clubbing, cyanosis, or edema. Respiratory: Normal respiratory effort, no increased work of breathing. GI: Abdomen is soft, nontender, nondistended, no abdominal masses GU: No CVA tenderness.  Prostate 60 g, smooth without nodules Lymph: No cervical or inguinal lymphadenopathy. Skin: No rashes, bruises or suspicious lesions. Neurologic: Grossly intact, no focal deficits, moving all 4 extremities. Psychiatric: Normal mood and affect.   Assessment & Plan:   84 year old male with history of BPH.  Although he has no bothersome symptoms a bladder scan was performed which showed an estimated bladder volume of 790 mL.  He was asked to completely empty and a repeat bladder scan was 740 mL.  These findings were discussed with the patient and his daughter.  It was recommended a Foley catheter be placed for 7 to 10 days to allow bladder rest with a follow-up voiding trial.  His daughter states he lives alone and states he would try to remove the catheter.  I also discussed intermittent catheterization however do not think he would be able to perform.  His daughter wants to discuss catheter placement with her husband.  This is most likely chronic.  A renal ultrasound was ordered.  Calculi GFR on 7/8 was > 60 mL/min.   Abbie Sons, [place] [address] [place], [place] [address] [phone]

## 2018-09-20 DIAGNOSIS — E785 Hyperlipidemia, unspecified: Secondary | ICD-10-CM | POA: Diagnosis not present

## 2018-09-20 DIAGNOSIS — M40209 Unspecified kyphosis, site unspecified: Secondary | ICD-10-CM | POA: Diagnosis not present

## 2018-09-20 DIAGNOSIS — N401 Enlarged prostate with lower urinary tract symptoms: Secondary | ICD-10-CM | POA: Diagnosis not present

## 2018-09-20 DIAGNOSIS — I509 Heart failure, unspecified: Secondary | ICD-10-CM | POA: Diagnosis not present

## 2018-09-20 DIAGNOSIS — Z0000 Encounter for general adult medical examination without abnormal findings: Secondary | ICD-10-CM | POA: Diagnosis not present

## 2019-06-10 ENCOUNTER — Other Ambulatory Visit: Payer: Self-pay

## 2019-08-01 IMAGING — CR DG LUMBAR SPINE 2-3V
4 series · 4 of 4 positions shown · non-contrast
Comparison: 04/10/2008

CLINICAL DATA: Fall with low back pain

EXAM:
LUMBAR SPINE - 2-3 VIEW

[l-spine ap (1 of 2)]
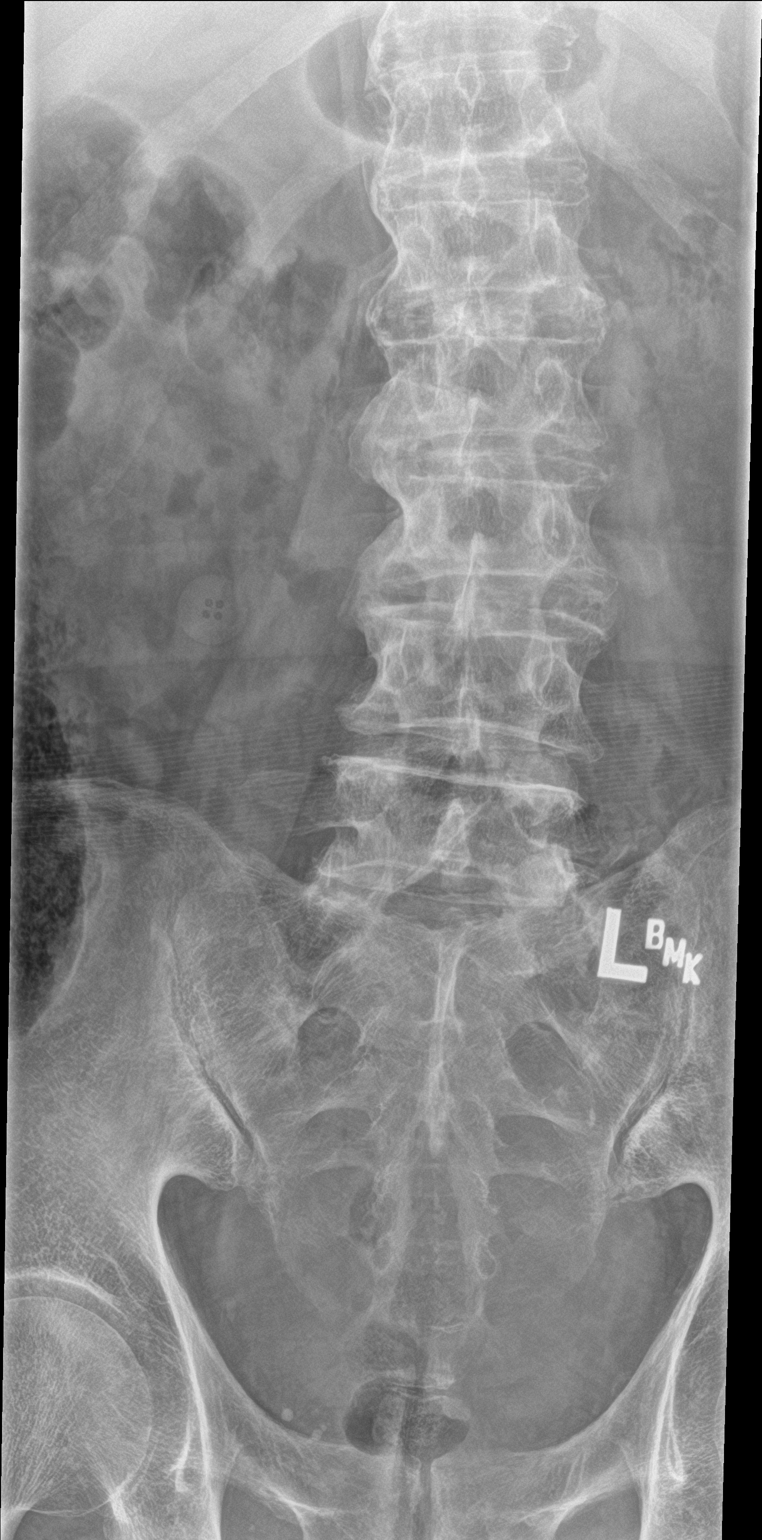

[l-spine lat]
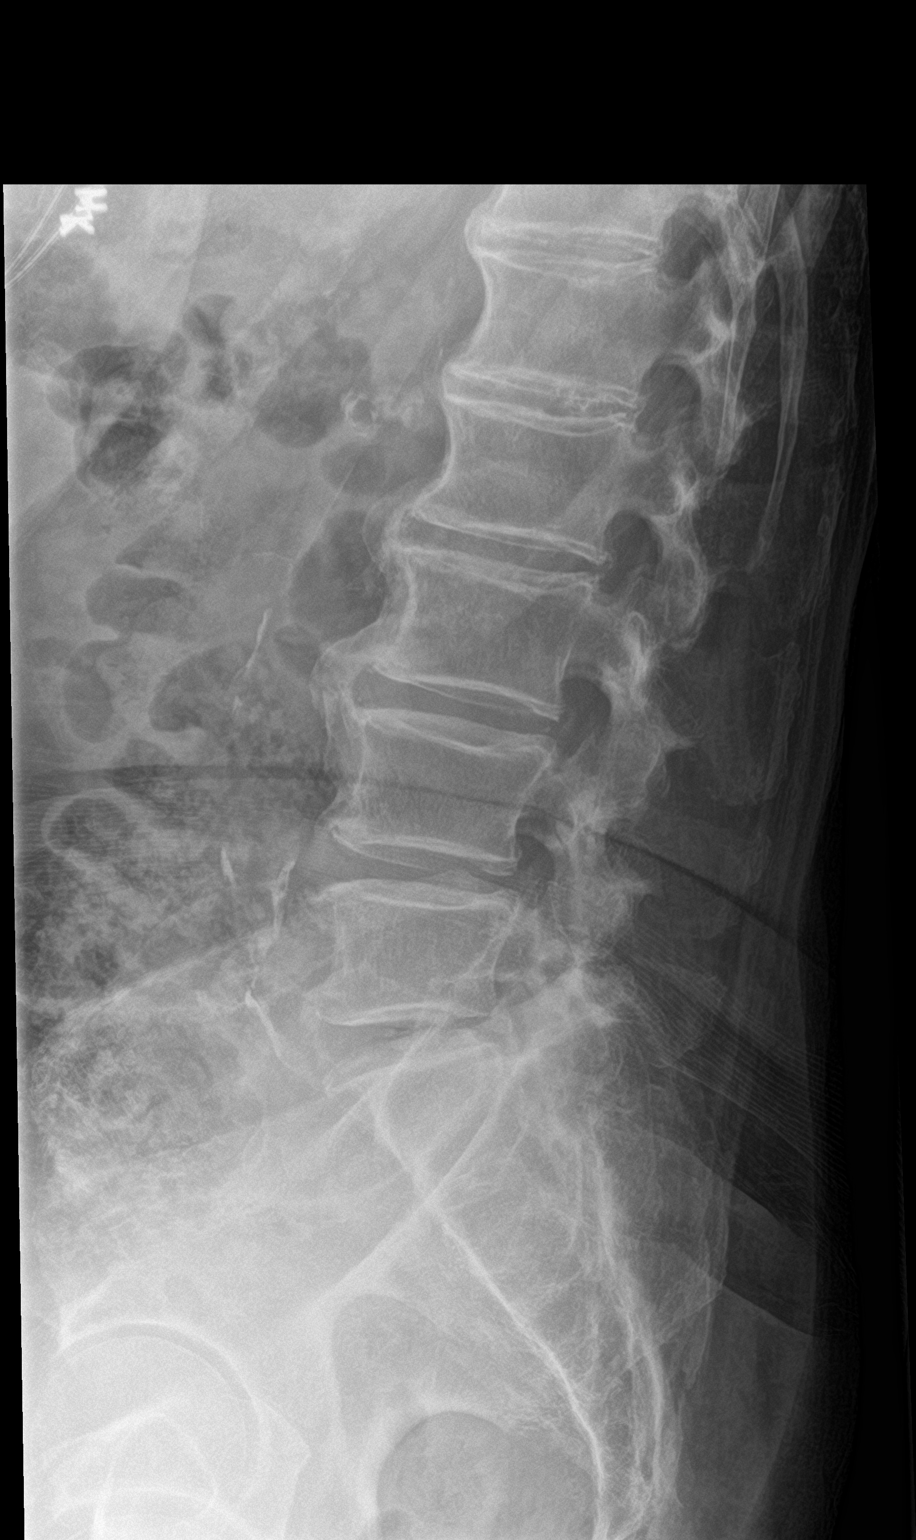

[l-spine spot]
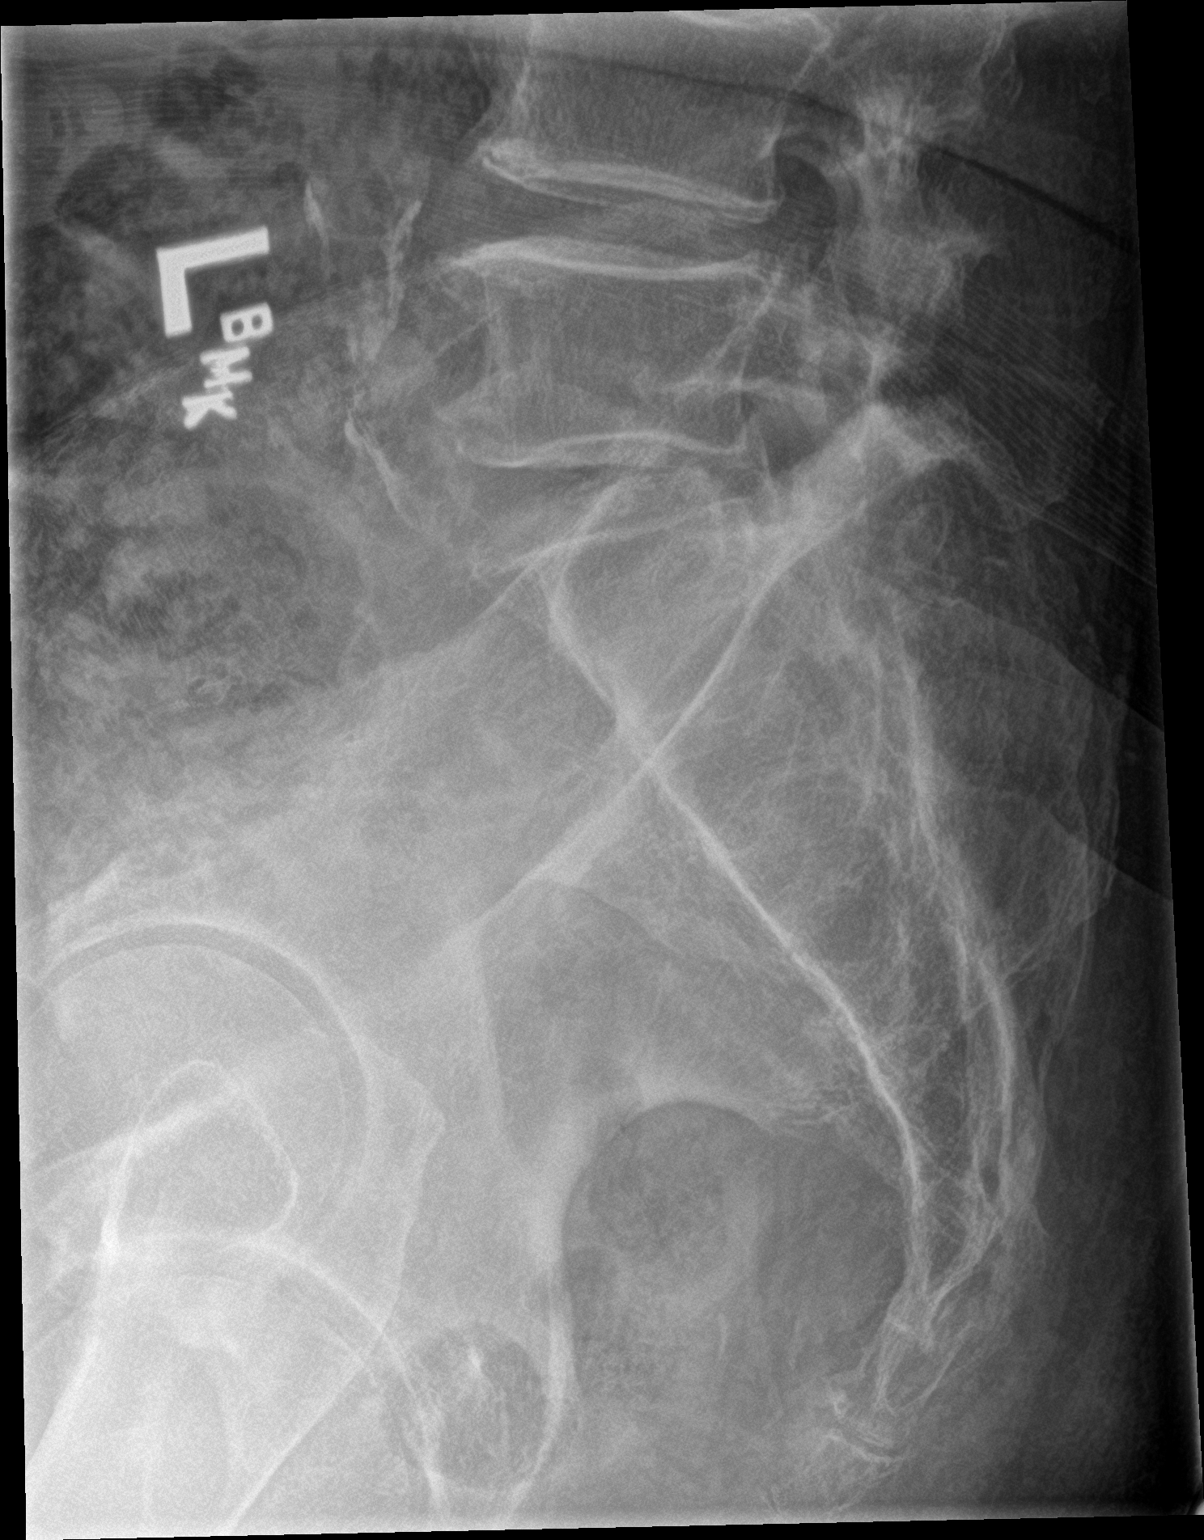

[l-spine ap (2 of 2)]
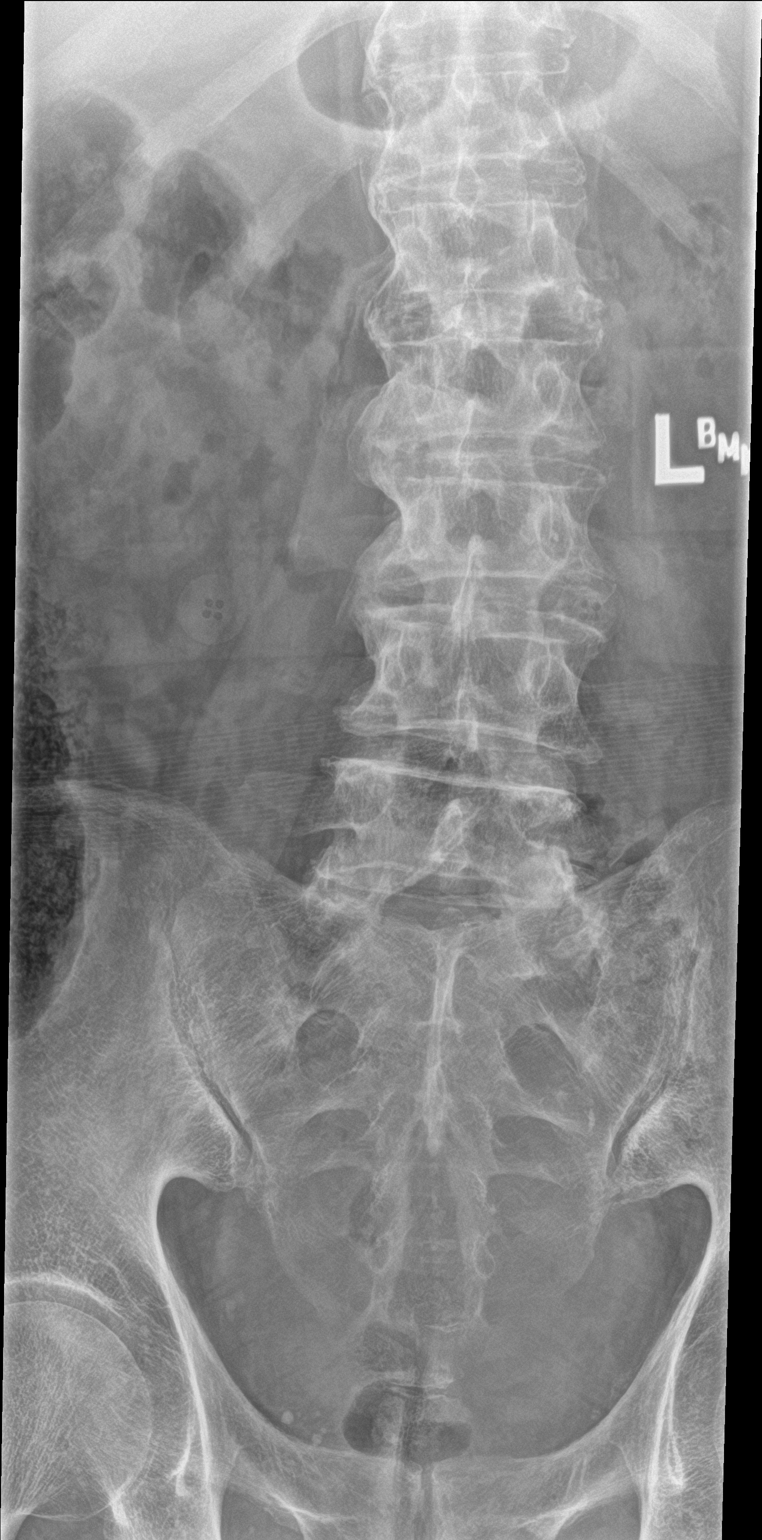

[4 of 4 positions shown; findings below may reference images not displayed]

FINDINGS: Five non rib-bearing lumbar type vertebra. SI joints are patent.
Vertebral body heights are normal. Flowing osteophytes over the
lumbar spine. Mild to moderate degenerative changes at L1-L2 and
L2-L3 with mild degenerative change at L3 through S1. Aortic
calcification
IMPRESSION: Degenerative changes.  No acute osseous abnormality.

## 2019-08-01 IMAGING — CT CT HEAD W/O CM
4 of 5 series · 15 of 47 positions shown, 17 images · non-contrast
Comparison: None.

CLINICAL DATA: Unwitnessed mechanical fall

EXAM:
CT HEAD WITHOUT CONTRAST
TECHNIQUE: Contiguous axial images were obtained from the base of the skull
through the vertex without intravenous contrast.

[Series 2: head wo · axial · 0.40mm/px · z∈[+224,+344]mm · 6 of 34 slices shown, 8 images (1 of 2)]
[im 5/34  brain]
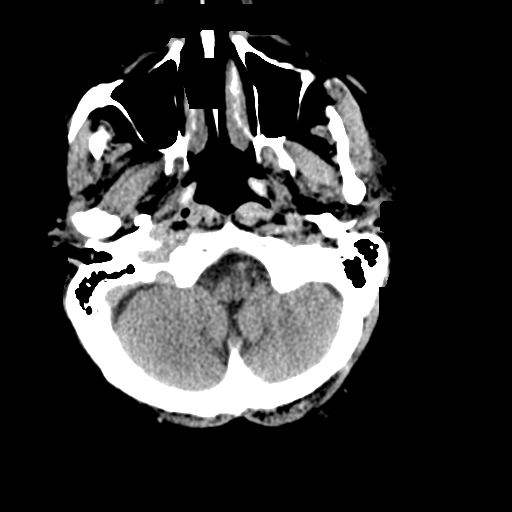
[im 5/34  bone]
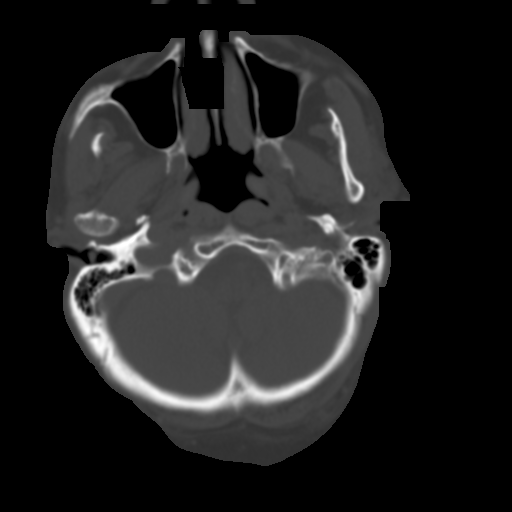
[im 10/34  brain]
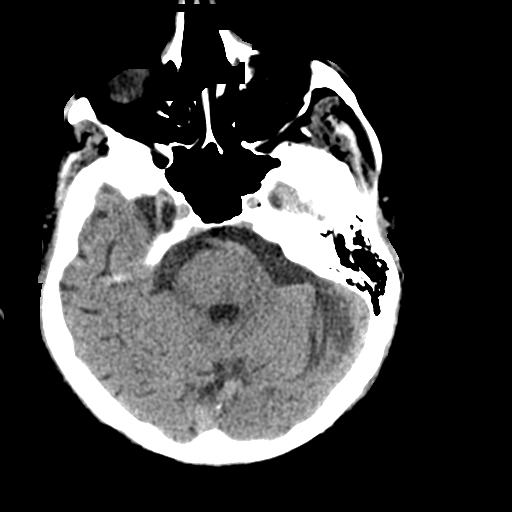
[im 15/34  brain]
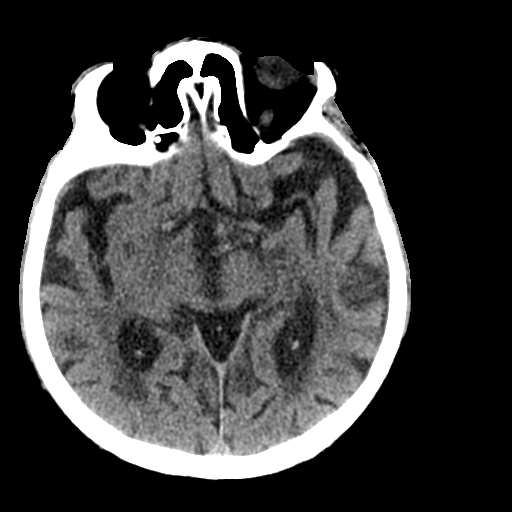
[im 19/34  brain]
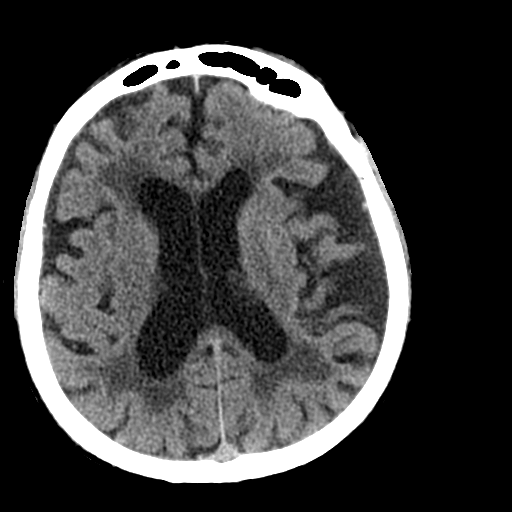
[im 24/34  brain]
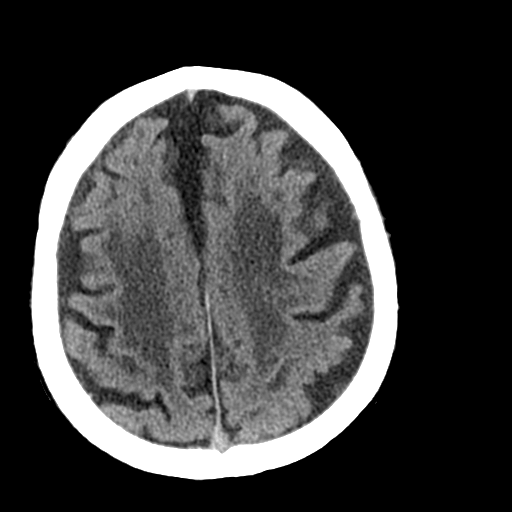
[im 24/34  bone]
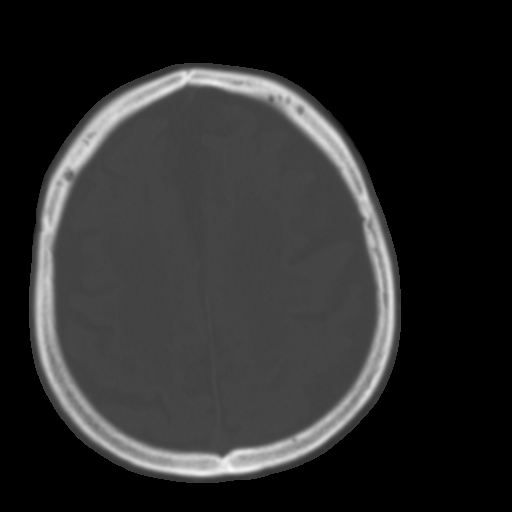
[im 29/34  brain]
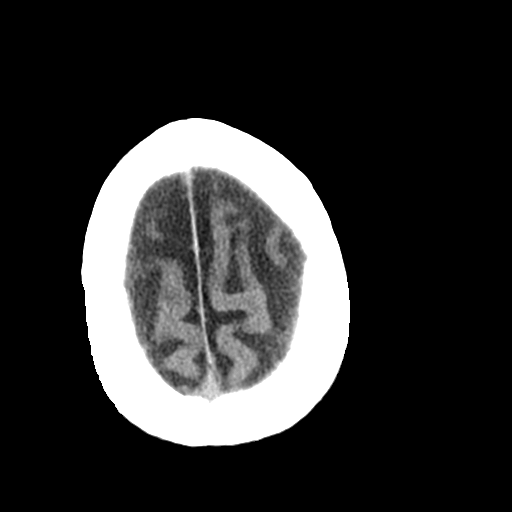

[Series 4: head wo · axial · 0.36mm/px · z∈[+299,+344]mm · 3 of 28 slices shown (2 of 2)]
[im 6/28  brain]
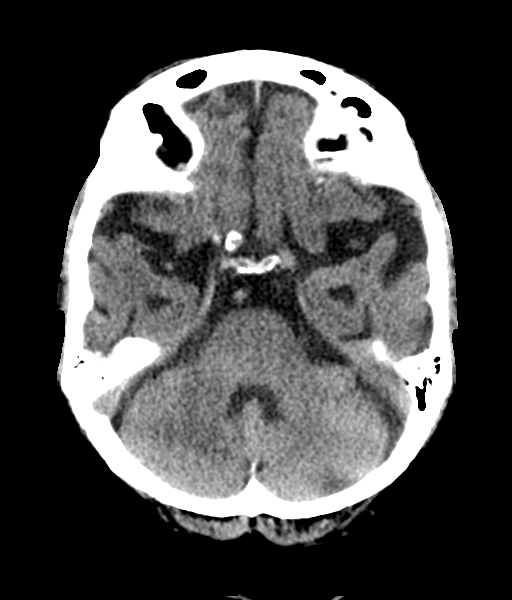
[im 11/28  brain]
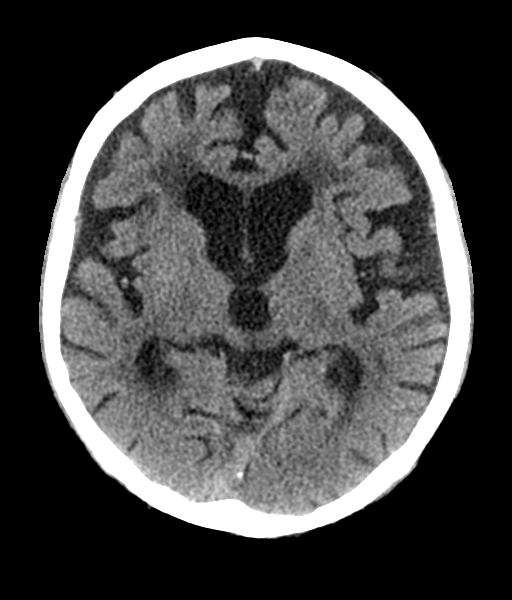
[im 17/28  brain]
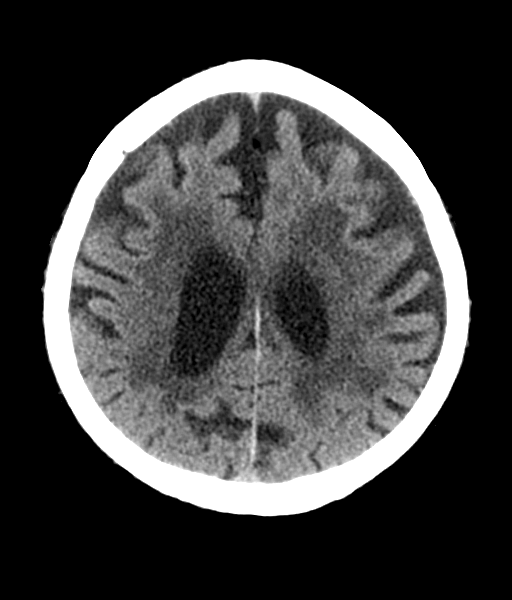

[Series 6: coronal soft tissue · coronal · 0.31mm/px · 3 of 66 slices shown]
[im 22/66  brain]
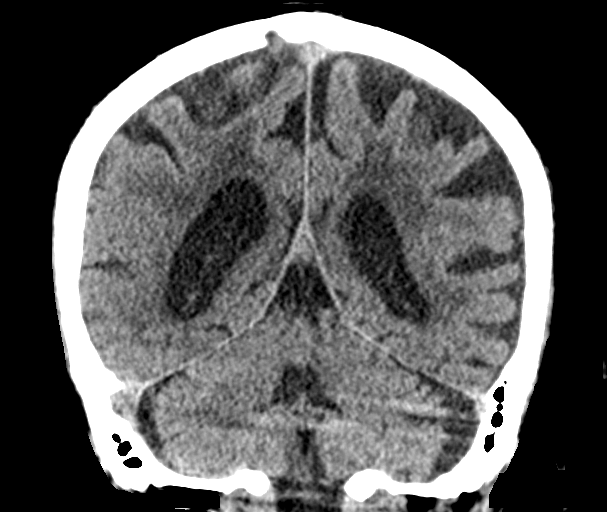
[im 29/66  brain]
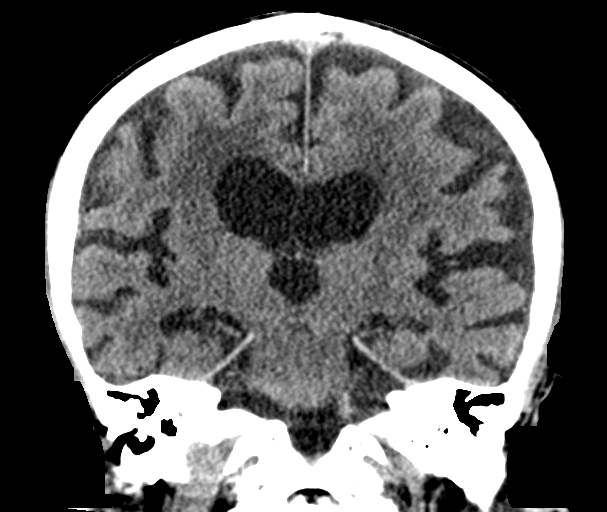
[im 37/66  brain]
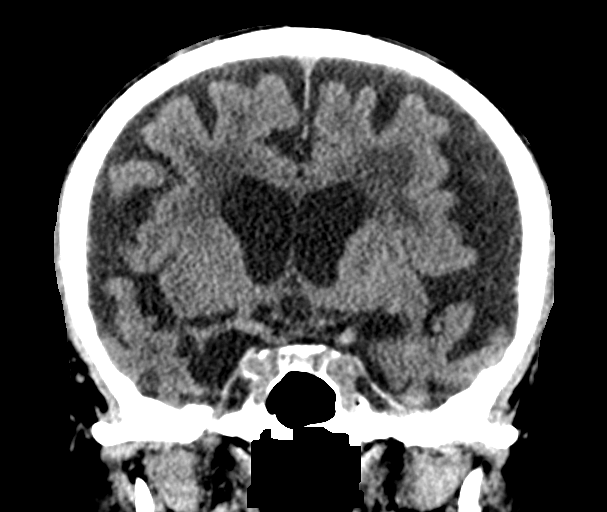

[Series 7: sagittal soft tissue · sagittal · 0.32mm/px · 3 of 54 slices shown]
[im 18/54  brain]
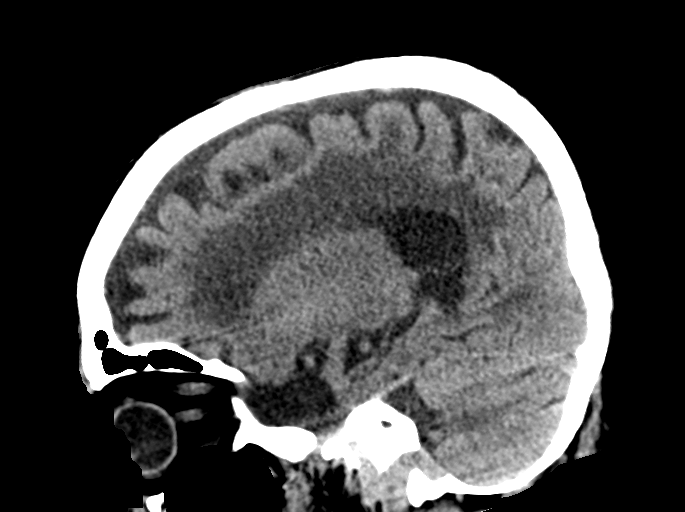
[im 27/54  brain]
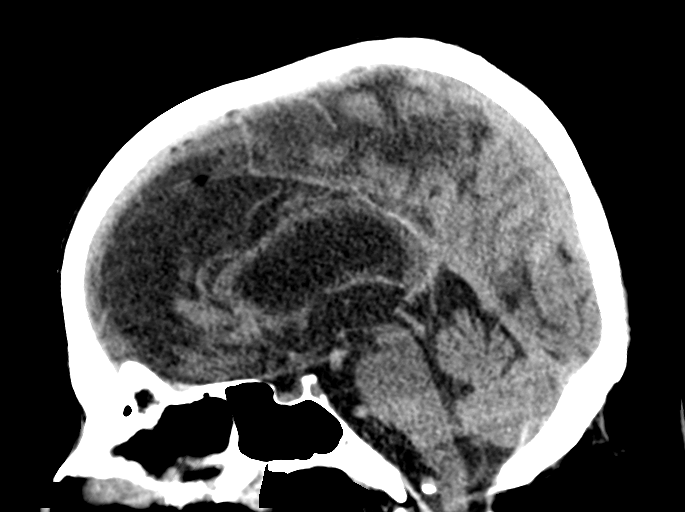
[im 36/54  brain]
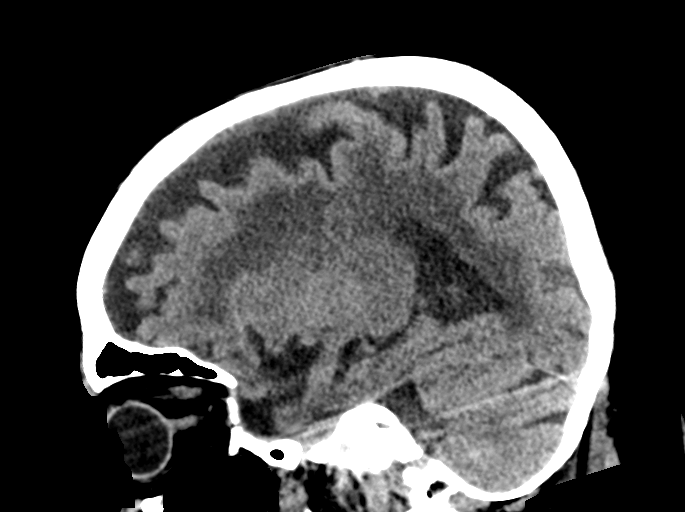

[15 of 47 positions shown; findings below may reference images not displayed]

FINDINGS: Brain: No acute territorial infarction, hemorrhage or intracranial
mass. Marked atrophy. Moderate severe small vessel ischemic changes
of the white matter. Enlarged ventricles, felt secondary to atrophy.

Vascular: No hyperdense vessels.  Carotid vascular calcification.

Skull: Normal. Negative for fracture or focal lesion.

Sinuses/Orbits: No acute finding.

Other: None
IMPRESSION: 1. No CT evidence for acute intracranial abnormality.
2. Significant atrophy and small vessel ischemic changes of the
white matter

## 2019-08-14 DIAGNOSIS — N401 Enlarged prostate with lower urinary tract symptoms: Secondary | ICD-10-CM | POA: Diagnosis not present

## 2019-08-14 DIAGNOSIS — E785 Hyperlipidemia, unspecified: Secondary | ICD-10-CM | POA: Diagnosis not present

## 2019-08-14 DIAGNOSIS — M40209 Unspecified kyphosis, site unspecified: Secondary | ICD-10-CM | POA: Diagnosis not present

## 2019-08-14 DIAGNOSIS — I509 Heart failure, unspecified: Secondary | ICD-10-CM | POA: Diagnosis not present

## 2020-02-12 DIAGNOSIS — I509 Heart failure, unspecified: Secondary | ICD-10-CM | POA: Diagnosis not present

## 2020-02-12 DIAGNOSIS — E785 Hyperlipidemia, unspecified: Secondary | ICD-10-CM | POA: Diagnosis not present

## 2020-02-12 DIAGNOSIS — N401 Enlarged prostate with lower urinary tract symptoms: Secondary | ICD-10-CM | POA: Diagnosis not present

## 2020-02-12 DIAGNOSIS — M40209 Unspecified kyphosis, site unspecified: Secondary | ICD-10-CM | POA: Diagnosis not present

## 2020-04-22 DIAGNOSIS — H903 Sensorineural hearing loss, bilateral: Secondary | ICD-10-CM | POA: Diagnosis not present

## 2020-04-22 DIAGNOSIS — H6123 Impacted cerumen, bilateral: Secondary | ICD-10-CM | POA: Diagnosis not present

## 2020-05-15 ENCOUNTER — Other Ambulatory Visit: Payer: Self-pay | Admitting: *Deleted

## 2020-05-15 MED ORDER — TAMSULOSIN HCL 0.4 MG PO CAPS: 0.4000 mg | ORAL_CAPSULE | Freq: Every day | ORAL | 6 refills | Status: AC

## 2020-07-01 ENCOUNTER — Other Ambulatory Visit: Payer: Self-pay

## 2020-07-01 ENCOUNTER — Ambulatory Visit (INDEPENDENT_AMBULATORY_CARE_PROVIDER_SITE_OTHER): Payer: Medicare Other | Admitting: Internal Medicine

## 2020-07-01 DIAGNOSIS — R972 Elevated prostate specific antigen [PSA]: Secondary | ICD-10-CM | POA: Diagnosis not present

## 2020-07-01 DIAGNOSIS — Z1329 Encounter for screening for other suspected endocrine disorder: Secondary | ICD-10-CM | POA: Diagnosis not present

## 2020-07-01 DIAGNOSIS — Z13220 Encounter for screening for lipoid disorders: Secondary | ICD-10-CM

## 2020-07-02 LAB — CBC WITH DIFFERENTIAL/PLATELET
Absolute Monocytes: 373 cells/uL (ref 200–950)
Basophils Absolute: 32 cells/uL (ref 0–200)
Basophils Relative: 0.7 %
Eosinophils Absolute: 18 cells/uL (ref 15–500)
Eosinophils Relative: 0.4 %
HCT: 41.5 % (ref 38.5–50.0)
Hemoglobin: 13.5 g/dL (ref 13.2–17.1)
Lymphs Abs: 1201 cells/uL (ref 850–3900)
MCH: 30.8 pg (ref 27.0–33.0)
MCHC: 32.5 g/dL (ref 32.0–36.0)
MCV: 94.5 fL (ref 80.0–100.0)
MPV: 10.3 fL (ref 7.5–12.5)
Monocytes Relative: 8.1 %
Neutro Abs: 2976 cells/uL (ref 1500–7800)
Neutrophils Relative %: 64.7 %
Platelets: 133 10*3/uL — ABNORMAL LOW (ref 140–400)
RBC: 4.39 10*6/uL (ref 4.20–5.80)
RDW: 13.5 % (ref 11.0–15.0)
Total Lymphocyte: 26.1 %
WBC: 4.6 10*3/uL (ref 3.8–10.8)

## 2020-07-02 LAB — LIPID PANEL
Cholesterol: 155 mg/dL (ref <200)
HDL: 50 mg/dL (ref >=40)
LDL Cholesterol (Calc): 89 mg/dL (calc)
Non-HDL Cholesterol (Calc): 105 mg/dL (calc) (ref <130)
Total CHOL/HDL Ratio: 3.1 (calc) (ref <5.0)
Triglycerides: 71 mg/dL (ref <150)

## 2020-07-02 LAB — COMPLETE METABOLIC PANEL WITH GFR
AG Ratio: 1.8 (calc) (ref 1.0–2.5)
ALT: 7 U/L — ABNORMAL LOW (ref 9–46)
AST: 11 U/L (ref 10–35)
Albumin: 4.2 g/dL (ref 3.6–5.1)
Alkaline phosphatase (APISO): 25 U/L — ABNORMAL LOW (ref 35–144)
BUN: 12 mg/dL (ref 7–25)
CO2: 26 mmol/L (ref 20–32)
Calcium: 9.3 mg/dL (ref 8.6–10.3)
Chloride: 97 mmol/L — ABNORMAL LOW (ref 98–110)
Creat: 0.98 mg/dL (ref 0.70–1.11)
GFR, Est African American: 77 mL/min/{1.73_m2} (ref >=60)
GFR, Est Non African American: 66 mL/min/{1.73_m2} (ref >=60)
Globulin: 2.3 g/dL (calc) (ref 1.9–3.7)
Glucose, Bld: 75 mg/dL (ref 65–99)
Potassium: 4.5 mmol/L (ref 3.5–5.3)
Sodium: 133 mmol/L — ABNORMAL LOW (ref 135–146)
Total Bilirubin: 1.2 mg/dL (ref 0.2–1.2)
Total Protein: 6.5 g/dL (ref 6.1–8.1)

## 2020-07-02 LAB — PSA: PSA: 17 ng/mL — ABNORMAL HIGH (ref <=4.0)

## 2020-07-02 LAB — TSH: TSH: 1.81 mIU/L (ref 0.40–4.50)

## 2020-07-06 ENCOUNTER — Other Ambulatory Visit: Payer: Self-pay

## 2020-07-06 ENCOUNTER — Encounter: Payer: Self-pay | Admitting: Internal Medicine

## 2020-07-06 ENCOUNTER — Ambulatory Visit (INDEPENDENT_AMBULATORY_CARE_PROVIDER_SITE_OTHER): Payer: Medicare Other | Admitting: Internal Medicine

## 2020-07-06 VITALS — BP 131/52 | HR 54 | Ht 64.0 in | Wt 163.0 lb

## 2020-07-06 DIAGNOSIS — R3914 Feeling of incomplete bladder emptying: Secondary | ICD-10-CM | POA: Diagnosis not present

## 2020-07-06 DIAGNOSIS — N401 Enlarged prostate with lower urinary tract symptoms: Secondary | ICD-10-CM | POA: Diagnosis not present

## 2020-07-06 DIAGNOSIS — Z0000 Encounter for general adult medical examination without abnormal findings: Secondary | ICD-10-CM

## 2020-07-06 DIAGNOSIS — R627 Adult failure to thrive: Secondary | ICD-10-CM | POA: Diagnosis not present

## 2020-07-06 DIAGNOSIS — M4000 Postural kyphosis, site unspecified: Secondary | ICD-10-CM | POA: Insufficient documentation

## 2020-07-06 DIAGNOSIS — R413 Other amnesia: Secondary | ICD-10-CM | POA: Insufficient documentation

## 2020-07-06 NOTE — Assessment & Plan Note (Signed)
Pt has progressive memory loss. He is unable to draw the clock. He also has cataracts and progressive hearing loss, but doesn't use a hearing aid. 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

## 2020-07-06 NOTE — Patient Instructions (Addendum)
For added protein, look for Boost Breeze (9g protein) or Ensure Clear (8g protein).

## 2020-07-06 NOTE — Assessment & Plan Note (Signed)
Pt has kyphoscoliosis of the back. He was advised to continue taking vitamin D and stay active.

## 2020-07-06 NOTE — Progress Notes (Signed)
Established Patient Office Visit  SUBJECTIVE:  Subjective  Patient ID: Henry [name], male    DOB: Sep 07, 1925  Age: 86 y.o. MRN: [id number]  CC:  Chief Complaint  Patient presents with  . Annual Exam    HPI Henry [name] is a 86 y.o. male presenting today for for an annual physical. He is accompanied today by his daughter.   He does complain of arthralgia, particularly in his back. He does have difficulty with his hearing as well. He does also have significant issues with his memory.   He is not vaccinated, but he is generally safe. His daughter states that she plans on getting vaccinated now that the pfizer vaccine has been approved by the FDA for his protection. He has not gotten his flu vaccine yet.   Past Medical History:  Diagnosis Date  . Arthritis   . History of DVT (deep vein thrombosis)   . Prostate disorder     Past Surgical History:  Procedure Laterality Date  . PROSTATE SURGERY      Family History  Problem Relation Age of Onset  . Esophageal cancer Father   . Heart disease Mother     Social History   Socioeconomic History  . Marital status: Married    Spouse name: Not on file  . Number of children: Not on file  . Years of education: Not on file  . Highest education level: Not on file  Occupational History  . Not on file  Tobacco Use  . Smoking status: Former Research scientist (life sciences)  . Smokeless tobacco: Never Used  Vaping Use  . Vaping Use: Never used  Substance and Sexual Activity  . Alcohol use: Yes    Comment: 1 glass of red wine  . Drug use: Never  . Sexual activity: Not Currently  Other Topics Concern  . Not on file  Social History Narrative  . Not on file   Social Determinants of Health   Financial Resource Strain:   . Difficulty of Paying Living Expenses: Not on file  Food Insecurity:   . Worried About Charity fundraiser in the Last Year: Not on file  . Ran Out of Food in the Last Year: Not on file  Transportation Needs:   . Lack of  Transportation (Medical): Not on file  . Lack of Transportation (Non-Medical): Not on file  Physical Activity:   . Days of Exercise per Week: Not on file  . Minutes of Exercise per Session: Not on file  Stress:   . Feeling of Stress : Not on file  Social Connections:   . Frequency of Communication with Friends and Family: Not on file  . Frequency of Social Gatherings with Friends and Family: Not on file  . Attends Religious Services: Not on file  . Active Member of Clubs or Organizations: Not on file  . Attends Archivist Meetings: Not on file  . Marital Status: Not on file  Intimate Partner Violence:   . Fear of Current or Ex-Partner: Not on file  . Emotionally Abused: Not on file  . Physically Abused: Not on file  . Sexually Abused: Not on file     Current Outpatient Medications:  .  aspirin 81 MG tablet, Take 1 tablet by mouth daily., Disp: , Rfl:  .  meloxicam (MOBIC) 15 MG tablet, Take 1 tablet (15 mg total) by mouth daily., Disp: 30 tablet, Rfl: 0 .  tamsulosin (FLOMAX) 0.4 MG CAPS capsule, Take 1 capsule (0.4 mg  total) by mouth daily., Disp: 30 capsule, Rfl: 6   No Known Allergies  ROS Review of Systems  Constitutional: Negative.   HENT: Positive for hearing loss.   Eyes: Negative.   Respiratory: Negative.   Cardiovascular: Negative.   Gastrointestinal: Negative.   Endocrine: Negative.   Genitourinary: Negative.   Musculoskeletal: Positive for arthralgias and back pain.  Skin: Negative.   Allergic/Immunologic: Negative.   Neurological: Negative.   Hematological: Negative.   Psychiatric/Behavioral: Negative.   All other systems reviewed and are negative.    OBJECTIVE:    Physical Exam Vitals reviewed.  Constitutional:      Appearance: Normal appearance.  HENT:     Mouth/Throat:     Mouth: Mucous membranes are moist.  Eyes:     Pupils: Pupils are equal, round, and reactive to light.  Neck:     Vascular: No carotid bruit.  Cardiovascular:      Rate and Rhythm: Normal rate and regular rhythm.     Pulses: Normal pulses.     Heart sounds: Normal heart sounds.  Pulmonary:     Effort: Pulmonary effort is normal.     Breath sounds: Normal breath sounds.  Abdominal:     General: Bowel sounds are normal.     Palpations: Abdomen is soft. There is no hepatomegaly, splenomegaly or mass.     Tenderness: There is no abdominal tenderness.     Hernia: No hernia is present.  Musculoskeletal:     Cervical back: Neck supple.     Thoracic back: Deformity (hunched) present.     Right lower leg: No edema.     Left lower leg: No edema.  Skin:    Findings: No rash.  Neurological:     Mental Status: He is alert and oriented to person, place, and time.     Motor: No weakness.     Gait: Gait abnormal.  Psychiatric:        Mood and Affect: Mood normal.        Speech: Speech is delayed.        Behavior: Behavior normal.        Cognition and Memory: Memory is impaired.     BP (!) 131/52   Pulse (!) 54   Ht 5\' 4"  (1.626 m)   Wt 163 lb (73.9 kg)   BMI 27.98 kg/m  Wt Readings from Last 3 Encounters:  07/06/20 163 lb (73.9 kg)  07/05/18 169 lb 3.2 oz (76.7 kg)  05/15/18 170 lb 9.6 oz (77.4 kg)    Health Maintenance Due  Topic Date Due  . COVID-19 Vaccine (1) Never done  . TETANUS/TDAP  Never done  . PNA vac Low Risk Adult (1 of 2 - PCV13) Never done  . INFLUENZA VACCINE  06/07/2020    There are no preventive care reminders to display for this patient.  CBC Latest Ref Rng & Units 07/01/2020 05/13/2018 05/12/2018  WBC 3.8 - 10.8 Thousand/uL 4.6 - 7.6  Hemoglobin 13.2 - 17.1 g/dL 13.5 13.4 13.6  Hematocrit 38 - 50 % 41.5 - 39.2(L)  Platelets 140 - 400 Thousand/uL 133(L) - 162   CMP Latest Ref Rng & Units 07/01/2020 05/14/2018 05/13/2018  Glucose 65 - 99 mg/dL 75 94 89  BUN 7 - 25 mg/dL 12 14 12   Creatinine 0.70 - 1.11 mg/dL 0.98 0.68 0.75  Sodium 135 - 146 mmol/L 133(L) 133(L) 135  Potassium 3.5 - 5.3 mmol/L 4.5 4.0 3.9  Chloride 98 -  110 mmol/L 97(L)  102 104  CO2 20 - 32 mmol/L 26 25 26   Calcium 8.6 - 10.3 mg/dL 9.3 8.3(L) 8.4(L)  Total Protein 6.1 - 8.1 g/dL 6.5 - -  Total Bilirubin 0.2 - 1.2 mg/dL 1.2 - -  Alkaline Phos 38 - 126 U/L - - -  AST 10 - 35 U/L 11 - -  ALT 9 - 46 U/L 7(L) - -    Lab Results  Component Value Date   TSH 1.81 07/01/2020   Lab Results  Component Value Date   ALBUMIN 3.3 (L) 05/11/2018   ANIONGAP 6 05/14/2018   Lab Results  Component Value Date   CHOL 155 07/01/2020   CHOL 124 05/13/2018   HDL 50 07/01/2020   HDL 41 05/13/2018   LDLCALC 89 07/01/2020   LDLCALC 73 05/13/2018   CHOLHDL 3.1 07/01/2020   CHOLHDL 3.0 05/13/2018   Lab Results  Component Value Date   TRIG 71 07/01/2020   No results found for: HGBA1C    ASSESSMENT & PLAN:   Problem List Items Addressed This Visit      Musculoskeletal and Integument   Kyphosis (acquired) (postural)    Pt has kyphoscoliosis of the back. He was advised to continue taking vitamin D and stay active.         Genitourinary   Enlarged prostate with lower urinary tract symptoms (LUTS)    Pt is being followed up by urologist.         Other   Memory change - Primary    Pt has progressive memory loss. He is unable to draw the clock. He also has cataracts and progressive hearing loss, but doesn't use a hearing aid. 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       Failure to thrive in adult    We advised him to drink Boost Breeze or Ensure Clear due to his aversion to milky liquids.          No orders of the defined types were placed in this encounter.   Follow-up: No follow-ups on file.    Dr. Jane Canary [facility] [address] [place], [place] [address]   By signing my name below, I, [employer], attest that this documentation has been prepared under the direction and in the presence of [name], MD. Electronically Signed: [name], MD 07/06/20, 1:07 PM    I personally performed the services described in this documentation, which was SCRIBED in my presence. The recorded information has been reviewed and considered accurate. It has been edited as necessary during review. [name], MD

## 2020-07-06 NOTE — Assessment & Plan Note (Addendum)
We advised him to drink Boost Breeze or Ensure Clear due to his aversion to milky liquids.

## 2020-07-06 NOTE — Assessment & Plan Note (Signed)
Pt is being followed up by urologist.

## 2020-08-24 DIAGNOSIS — H903 Sensorineural hearing loss, bilateral: Secondary | ICD-10-CM | POA: Diagnosis not present

## 2020-08-24 DIAGNOSIS — H6123 Impacted cerumen, bilateral: Secondary | ICD-10-CM | POA: Diagnosis not present

## 2020-12-03 ENCOUNTER — Other Ambulatory Visit: Payer: Self-pay | Admitting: *Deleted

## 2020-12-03 DIAGNOSIS — N401 Enlarged prostate with lower urinary tract symptoms: Secondary | ICD-10-CM

## 2020-12-03 MED ORDER — CIPROFLOXACIN HCL 500 MG PO TABS: 500.0000 mg | ORAL_TABLET | Freq: Two times a day (BID) | ORAL | 0 refills | Status: AC

## 2020-12-10 ENCOUNTER — Telehealth: Payer: Self-pay | Admitting: Adult Health Nurse Practitioner

## 2021-01-05 NOTE — Telephone Encounter (Signed)
Spoke with daughter-in-law, Altha Harm and she was in agreement with scheduling a Palliative visit with NP. NP had an opening on 12/14/20 @ 12:30 PM but they had a family funeral to attend at 2 that day and they wanted patient seen as soon as possible.  She was in agreement with a visit on 2/7 at 11 AM and I told her that I needed to contact that patient to see if they would change the time of their appointment and that if I wasn't able to do this then NP could see the patient on 12/15/20 @ 8:30 AM and she was in agreement with this.

## 2021-01-05 DEATH — deceased
